# Patient Record
Sex: Female | Born: 1975 | Hispanic: No | Marital: Married | Smoking: Light tobacco smoker
Health system: Southern US, Community
[De-identification: ages and names within clinical notes are randomized; demographics above are authoritative.]

## PROBLEM LIST (undated history)

## (undated) DIAGNOSIS — R600 Localized edema: Secondary | ICD-10-CM

## (undated) DIAGNOSIS — M545 Low back pain, unspecified: Secondary | ICD-10-CM

## (undated) DIAGNOSIS — K5900 Constipation, unspecified: Secondary | ICD-10-CM

## (undated) DIAGNOSIS — E669 Obesity, unspecified: Secondary | ICD-10-CM

## (undated) DIAGNOSIS — K4090 Unilateral inguinal hernia, without obstruction or gangrene, not specified as recurrent: Secondary | ICD-10-CM

## (undated) DIAGNOSIS — R0602 Shortness of breath: Secondary | ICD-10-CM

## (undated) DIAGNOSIS — G8929 Other chronic pain: Secondary | ICD-10-CM

## (undated) DIAGNOSIS — E781 Pure hyperglyceridemia: Secondary | ICD-10-CM

## (undated) DIAGNOSIS — R7303 Prediabetes: Secondary | ICD-10-CM

## (undated) HISTORY — DX: Low back pain: M54.5

## (undated) HISTORY — DX: Obesity, unspecified: E66.9

## (undated) HISTORY — DX: Localized edema: R60.0

## (undated) HISTORY — DX: Low back pain, unspecified: M54.50

## (undated) HISTORY — DX: Other chronic pain: G89.29

## (undated) HISTORY — PX: TUBAL LIGATION: SHX77

## (undated) HISTORY — DX: Constipation, unspecified: K59.00

## (undated) HISTORY — DX: Unilateral inguinal hernia, without obstruction or gangrene, not specified as recurrent: K40.90

## (undated) HISTORY — DX: Shortness of breath: R06.02

---
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
## 1997-12-19 ENCOUNTER — Emergency Department (HOSPITAL_COMMUNITY): Admission: EM | Admit: 1997-12-19 | Discharge: 1997-12-19 | Payer: Self-pay | Admitting: Emergency Medicine

## 1998-01-19 ENCOUNTER — Emergency Department (HOSPITAL_COMMUNITY): Admission: EM | Admit: 1998-01-19 | Discharge: 1998-01-19 | Payer: Self-pay | Admitting: Emergency Medicine

## 1999-04-11 ENCOUNTER — Emergency Department (HOSPITAL_COMMUNITY): Admission: EM | Admit: 1999-04-11 | Discharge: 1999-04-11 | Payer: Self-pay | Admitting: Emergency Medicine

## 1999-04-11 ENCOUNTER — Encounter: Payer: Self-pay | Admitting: Emergency Medicine

## 1999-05-14 ENCOUNTER — Other Ambulatory Visit: Admission: RE | Admit: 1999-05-14 | Discharge: 1999-05-14 | Payer: Self-pay | Admitting: Obstetrics

## 1999-05-21 ENCOUNTER — Ambulatory Visit (HOSPITAL_COMMUNITY): Admission: RE | Admit: 1999-05-21 | Discharge: 1999-05-21 | Payer: Self-pay | Admitting: Obstetrics

## 1999-05-21 ENCOUNTER — Encounter: Payer: Self-pay | Admitting: Obstetrics

## 1999-06-27 ENCOUNTER — Inpatient Hospital Stay (HOSPITAL_COMMUNITY): Admission: AD | Admit: 1999-06-27 | Discharge: 1999-06-27 | Payer: Self-pay | Admitting: Obstetrics

## 1999-11-13 ENCOUNTER — Encounter (INDEPENDENT_AMBULATORY_CARE_PROVIDER_SITE_OTHER): Payer: Self-pay

## 1999-11-13 ENCOUNTER — Inpatient Hospital Stay (HOSPITAL_COMMUNITY): Admission: AD | Admit: 1999-11-13 | Discharge: 1999-11-16 | Payer: Self-pay | Admitting: Obstetrics

## 2000-03-01 ENCOUNTER — Emergency Department (HOSPITAL_COMMUNITY): Admission: EM | Admit: 2000-03-01 | Discharge: 2000-03-01 | Payer: Self-pay | Admitting: Emergency Medicine

## 2000-03-01 ENCOUNTER — Encounter: Payer: Self-pay | Admitting: Emergency Medicine

## 2003-08-30 ENCOUNTER — Emergency Department (HOSPITAL_COMMUNITY): Admission: EM | Admit: 2003-08-30 | Discharge: 2003-08-30 | Payer: Self-pay | Admitting: Emergency Medicine

## 2003-11-10 ENCOUNTER — Emergency Department (HOSPITAL_COMMUNITY): Admission: EM | Admit: 2003-11-10 | Discharge: 2003-11-10 | Payer: Self-pay | Admitting: Emergency Medicine

## 2006-11-21 ENCOUNTER — Emergency Department (HOSPITAL_COMMUNITY): Admission: EM | Admit: 2006-11-21 | Discharge: 2006-11-21 | Payer: Self-pay | Admitting: Emergency Medicine

## 2008-04-09 ENCOUNTER — Emergency Department (HOSPITAL_COMMUNITY): Admission: EM | Admit: 2008-04-09 | Discharge: 2008-04-10 | Payer: Self-pay | Admitting: Emergency Medicine

## 2010-05-08 ENCOUNTER — Emergency Department (HOSPITAL_COMMUNITY): Admission: EM | Admit: 2010-05-08 | Discharge: 2010-05-08 | Payer: Self-pay | Admitting: Emergency Medicine

## 2010-06-19 ENCOUNTER — Other Ambulatory Visit: Admission: RE | Admit: 2010-06-19 | Discharge: 2010-06-19 | Payer: Self-pay | Admitting: Family Medicine

## 2010-07-11 ENCOUNTER — Emergency Department (HOSPITAL_COMMUNITY)
Admission: EM | Admit: 2010-07-11 | Discharge: 2010-07-11 | Payer: Self-pay | Source: Home / Self Care | Admitting: Emergency Medicine

## 2010-10-23 LAB — RAPID STREP SCREEN (MED CTR MEBANE ONLY): Streptococcus, Group A Screen (Direct): NEGATIVE

## 2010-10-23 LAB — STREP A DNA PROBE: Group A Strep Probe: NEGATIVE

## 2010-12-28 NOTE — Discharge Summary (Signed)
[facility]  Patient:    KAREEMAH, GROUNDS                     MRN: [id number] Adm. Date:  [id number] Disc. Date: [id number] Attending:  Venita Sheffield                           Discharge Summary  HISTORY:                      This is a 35 year old, gravida 3, para 2-0-2, [facility]  November 20, 1999, who was admitted in labor, 5 cm, -2 station, positive GVS, was treated with penicillin.  Progressed slowly and had a normal vaginal delivery of female, Apgar 12 and 9 on November 14, 1999.  Post delivery the patient desired sterilization and on April 5 underwent postpartum tubal ligation.  Her hemoglobin on admission was 11.1, postop 10.3.  The patient did well and was discharged home on November 16, 1999, ambulatory and on a regular diet.  DISCHARGE MEDICATIONS:        Tylox No 3. one q.4-h. p.r.n.  FOLLOWUP:                     See me in six weeks.  DISCHARGE DIAGNOSES:          1. Status post normal vaginal delivery at term.  2. Postpartum tubal ligation. DD:  11/16/99 TD:  11/19/99 Job: [id number]

## 2010-12-28 NOTE — Op Note (Signed)
[facility]  Patient:    Julie Guerrero, Julie Guerrero                     MRN: [id number] Proc. Date: 11/15/99 Adm. Date:  [id number] Attending:  Venita Sheffield                           Operative Report  5PREOPERATIVE DIAGNOSIS:       Multiparity.  POSTOPERATIVE DIAGNOSIS:  OPERATION:                    Postpartum tubal ligation.  SURGEON:                      Kathreen Cosier, M.D.  ASSISTANT:  ANESTHESIA:                   Epidural  ESTIMATED BLOOD LOSS:  INDICATIONS:  DESCRIPTION OF PROCEDURE:     The patient was placed on the operating room table in the supine position.  The abdomen prepped and draped.  Bladder emptied with straight  catheter.   A midline subumbilical incision 1 inch long was made and carried on to the fascia.  The fascia was cleaned and grasped with two Kochers.  The fascia and the peritoneum were opened with Mayo scissors.  The left tube was grasped in the mid portion with a Babcock clamp and the tube traced to the fimbria. A 0 plain suture was placed in the mesosalpinx below the portion of tube within the clamp and this was tied and approximately 1 inch of tube transected.  This was one bilaterally.  The lap and sponge count was correct.  The abdomen was closed in layers. Peritoneum and fascia with continuous suture of 0 Dexon and skin closed  with subcuticular stitch of 3-0 plain.  The patient tolerated the procedure well and returned to the recovery room in good condition. DD:  11/15/99 TD:  11/15/99 Job: [id number]

## 2012-01-16 ENCOUNTER — Emergency Department (HOSPITAL_COMMUNITY): Payer: PRIVATE HEALTH INSURANCE

## 2012-01-16 ENCOUNTER — Emergency Department (HOSPITAL_COMMUNITY)
Admission: EM | Admit: 2012-01-16 | Discharge: 2012-01-16 | Disposition: A | Discharge status: Home / Self Care | Payer: PRIVATE HEALTH INSURANCE | Attending: Emergency Medicine | Admitting: Emergency Medicine

## 2012-01-16 ENCOUNTER — Encounter (HOSPITAL_COMMUNITY): Payer: Self-pay | Admitting: *Deleted

## 2012-01-16 DIAGNOSIS — T148XXA Other injury of unspecified body region, initial encounter: Secondary | ICD-10-CM

## 2012-01-16 DIAGNOSIS — X58XXXA Exposure to other specified factors, initial encounter: Secondary | ICD-10-CM | POA: Insufficient documentation

## 2012-01-16 DIAGNOSIS — M549 Dorsalgia, unspecified: Secondary | ICD-10-CM | POA: Insufficient documentation

## 2012-01-16 DIAGNOSIS — M25519 Pain in unspecified shoulder: Secondary | ICD-10-CM | POA: Insufficient documentation

## 2012-01-16 MED ORDER — KETOROLAC TROMETHAMINE 60 MG/2ML IM SOLN
INTRAMUSCULAR | Status: AC
  Administered 2012-01-16: 60 mg via INTRAMUSCULAR
  Filled 2012-01-16: qty 2

## 2012-01-16 MED ORDER — KETOROLAC TROMETHAMINE 30 MG/ML IJ SOLN: 60.0000 mg | Freq: Once | INTRAMUSCULAR | Status: DC

## 2012-01-16 MED ORDER — NAPROXEN 500 MG PO TABS: 500.0000 mg | ORAL_TABLET | Freq: Two times a day (BID) | ORAL | Status: AC

## 2012-01-16 MED ORDER — HYDROCODONE-ACETAMINOPHEN 5-325 MG PO TABS: 1.0000 | ORAL_TABLET | Freq: Four times a day (QID) | ORAL | Status: AC | PRN

## 2012-01-16 MED ORDER — HYDROMORPHONE HCL PF 1 MG/ML IJ SOLN
1.0000 mg | Freq: Once | INTRAMUSCULAR | Status: AC
  Administered 2012-01-16: 1 mg via INTRAMUSCULAR
  Filled 2012-01-16: qty 1

## 2012-01-16 NOTE — Discharge Instructions (Signed)
Rest at home until Monday.  Follow up next week if not improving.

## 2012-01-16 NOTE — ED Notes (Signed)
Patient transported to X-ray 

## 2012-01-16 NOTE — ED Provider Notes (Signed)
History   This chart was scribed for Julie Lennert, MD by Julie Guerrero . The patient was seen in room STRE1/STRE1.   CSN: [id number]  Arrival date & time 01/16/12  1443   First MD Initiated Contact with Patient 01/16/12 1458      Chief Complaint  Patient presents with  . Back Pain    (Consider location/radiation/quality/duration/timing/severity/associated sxs/prior treatment) HPI Comments: Patient states that she has constant, moderate left upper back/shoulder pain for the past 2 weeks. Patient states that her back pain has worsened over past 2 days. Patient denies any recent injuries. Patient states that the pain is worsened with movement. Nothing makes the symptoms better.   Patient is a 36 y.o. female presenting with back pain. The history is provided by the patient.  Back Pain  This is a new problem. The current episode started more than 1 week ago. The problem occurs constantly. The problem has been gradually worsening. The pain is associated with no known injury. The pain is present in the thoracic spine. The pain does not radiate. The pain is moderate. Pertinent negatives include no fever and no weakness. She has tried nothing for the symptoms.    History reviewed. No pertinent past medical history.  History reviewed. No pertinent past surgical history.  History reviewed. No pertinent family history.  History  Substance Use Topics  . Smoking status: Current Everyday Smoker    Types: Cigarettes  . Smokeless tobacco: Not on file  . Alcohol Use: No    OB History    Grav Para Term Preterm Abortions TAB SAB Ect Mult Living                  Review of Systems  Constitutional: Negative for fever and chills.  Respiratory: Negative for shortness of breath.   Gastrointestinal: Negative for nausea and vomiting.  Musculoskeletal: Positive for back pain.  Neurological: Negative for weakness.  All other systems reviewed and are negative.    Allergies  Review  of patient's allergies indicates no known allergies.  Home Medications   Current Outpatient Rx  Name Route Sig Dispense Refill  . BC FAST PAIN RELIEF ARTHRITIS PO Oral Take 1 packet by mouth daily as needed. For pain    . IBUPROFEN 200 MG PO TABS Oral Take 600 mg by mouth every 6 (six) hours as needed. For pain      BP 126/69  Pulse 63  Temp(Src) 98.2 F (36.8 C) (Oral)  Resp 18  SpO2 100%  LMP 01/07/2012  Physical Exam  Constitutional: She is oriented to person, place, and time. She appears well-developed.  HENT:  Head: Normocephalic and atraumatic.  Eyes: Conjunctivae and EOM are normal. No scleral icterus.  Neck: Neck supple. No thyromegaly present.  Cardiovascular: Normal rate and regular rhythm.  Exam reveals no gallop and no friction rub.   No murmur heard. Pulmonary/Chest: No stridor. She has no wheezes. She has no rales. She exhibits no tenderness.  Abdominal: She exhibits no distension. There is no tenderness. There is no rebound.  Musculoskeletal: Normal range of motion. She exhibits tenderness. She exhibits no edema.       Tenderness in left trapezius muscle.   Lymphadenopathy:    She has no cervical adenopathy.  Neurological: She is oriented to person, place, and time. Coordination normal.       Strength 5/5. Neurovascular exam is normal.   Skin: No rash noted. No erythema.  Psychiatric: She has a normal mood and affect.  Her behavior is normal.    ED Course  Procedures (including critical care time)  DIAGNOSTIC STUDIES: Oxygen Saturation is 100% on room air, normal by my interpretation.    COORDINATION OF CARE:  1511: Discussed planned course of treatment with the patient, who is agreeable at this time. Will order x-ray. 1515: Medication Orders: Ketorolac (Toradol) 30 mg/mL injection 60 mg-once.  1631: Recheck: Informed patient of imaging results and planned d/c.    Labs Reviewed - No data to display Dg Chest 2 View  01/16/2012  *RADIOLOGY REPORT*   Clinical Data: 36 year old female with left shoulder pain.  CHEST - 2 VIEW  Comparison: None.  Findings: Slightly shallow lung volumes.  Cardiac size and mediastinal contours are within normal limits.  Visualized tracheal air column is within normal limits.  The lungs are clear.  No pneumothorax or effusion. Bone mineralization is within normal limits. No acute osseous abnormality identified.  IMPRESSION: Negative, no acute cardiopulmonary abnormality.  Original Report Authenticated By: Julie Guerrero, M.D.   Dg Cervical Spine Complete  01/16/2012  *RADIOLOGY REPORT*  Clinical Data: Back pain  CERVICAL SPINE - COMPLETE 4+ VIEW  Comparison: None  Findings: Five views of cervical spine submitted.  No acute fracture or subluxation.  Minimal disc space flattening at C4-C5 level.  No prevertebral soft tissue swelling.  C1-C2 relationship is unremarkable.  IMPRESSION: No acute fracture or subluxation.  Minimal disc space flattening at C4-C5 level.  Original Report Authenticated By: Julie Guerrero, M.D.     No diagnosis found.    MDM     The chart was scribed for me under my direct supervision.  I personally performed the history, physical, and medical decision making and all procedures in the evaluation of this patient.Julie Lennert, MD 01/16/12 [phone]

## 2012-01-16 NOTE — ED Notes (Signed)
Reports left upper back/shoulder pain x 2 weeks, denies injury, ambulatory at triage.

## 2013-09-14 ENCOUNTER — Emergency Department (HOSPITAL_COMMUNITY)
Admission: EM | Admit: 2013-09-14 | Discharge: 2013-09-14 | Disposition: A | Discharge status: Home / Self Care | Payer: PRIVATE HEALTH INSURANCE | Attending: Emergency Medicine | Admitting: Emergency Medicine

## 2013-09-14 ENCOUNTER — Encounter (HOSPITAL_COMMUNITY): Payer: Self-pay | Admitting: Emergency Medicine

## 2013-09-14 DIAGNOSIS — X500XXA Overexertion from strenuous movement or load, initial encounter: Secondary | ICD-10-CM | POA: Insufficient documentation

## 2013-09-14 DIAGNOSIS — Y9389 Activity, other specified: Secondary | ICD-10-CM | POA: Insufficient documentation

## 2013-09-14 DIAGNOSIS — S86911A Strain of unspecified muscle(s) and tendon(s) at lower leg level, right leg, initial encounter: Secondary | ICD-10-CM

## 2013-09-14 DIAGNOSIS — Y9289 Other specified places as the place of occurrence of the external cause: Secondary | ICD-10-CM | POA: Insufficient documentation

## 2013-09-14 DIAGNOSIS — F17200 Nicotine dependence, unspecified, uncomplicated: Secondary | ICD-10-CM | POA: Insufficient documentation

## 2013-09-14 DIAGNOSIS — IMO0002 Reserved for concepts with insufficient information to code with codable children: Secondary | ICD-10-CM | POA: Insufficient documentation

## 2013-09-14 MED ORDER — CYCLOBENZAPRINE HCL 10 MG PO TABS: 10.0000 mg | ORAL_TABLET | Freq: Two times a day (BID) | ORAL | Status: DC | PRN

## 2013-09-14 MED ORDER — IBUPROFEN 800 MG PO TABS: 800.0000 mg | ORAL_TABLET | Freq: Three times a day (TID) | ORAL | Status: DC

## 2013-09-14 NOTE — ED Notes (Signed)
Pt reporting yesterday she was bending over to get groceries out of her truck and felt her right calf muscle "pull". No swelling or redness noted. Pt can bear weight. Can flex/dorsiflex her foot.

## 2013-09-14 NOTE — ED Provider Notes (Signed)
Medical screening examination/treatment/procedure(s) were performed by non-physician practitioner and as supervising physician I was immediately available for consultation/collaboration.  EKG Interpretation   None         Shanna CiscoMegan E Lambert Jeanty, MD 09/14/13 2054

## 2013-09-14 NOTE — Discharge Instructions (Signed)
RICE: Routine Care for Injuries The routine care of many injuries includes Rest, Ice, Compression, and Elevation (RICE). HOME CARE INSTRUCTIONS  Rest is needed to allow your body to heal. Routine activities can usually be resumed when comfortable. Injured tendons and bones can take up to 6 weeks to heal. Tendons are the cord-like structures that attach muscle to bone.  Ice following an injury helps keep the swelling down and reduces pain.  Put ice in a plastic bag.  Place a towel between your skin and the bag.  Leave the ice on for 15-20 minutes, 03-04 times a day. Do this while awake, for the first 24 to 48 hours. After that, continue as directed by your caregiver.  Compression helps keep swelling down. It also gives support and helps with discomfort. If an elastic bandage has been applied, it should be removed and reapplied every 3 to 4 hours. It should not be applied tightly, but firmly enough to keep swelling down. Watch fingers or toes for swelling, bluish discoloration, coldness, numbness, or excessive pain. If any of these problems occur, remove the bandage and reapply loosely. Contact your caregiver if these problems continue.  Elevation helps reduce swelling and decreases pain. With extremities, such as the arms, hands, legs, and feet, the injured area should be placed near or above the level of the heart, if possible. SEEK IMMEDIATE MEDICAL CARE IF:  You have persistent pain and swelling.  You develop redness, numbness, or unexpected weakness.  Your symptoms are getting worse rather than improving after several days. These symptoms may indicate that further evaluation or further X-rays are needed. Sometimes, X-rays may not show a small broken bone (fracture) until 1 week or 10 days later. Make a follow-up appointment with your caregiver. Ask when your X-ray results will be ready. Make sure you get your X-ray results. Document Released: 11/10/2000 Document Revised: 10/21/2011  Document Reviewed: 12/28/2010 [employer] Patient Information 2014 [employer].  

## 2013-09-14 NOTE — ED Provider Notes (Signed)
CSN: [id number]     Arrival date & time 09/14/13  1455 History  This chart was scribed for non-physician practitioner, Majel HomerBowie Giamarie Bueche-PA, working with Shanna CiscoMegan E Docherty, MD by Smiley HousemanFallon Bowsher, ED Scribe. This patient was seen in room TR10C/TR10C and the patient's care was started at 4:10 PM.  Chief Complaint  Patient presents with  . Leg Pain   The history is provided by the patient. No language interpreter was used.   HPI Comments: Julie Guerrero is a 38 y.o. female who presents to the Emergency Department complaining of constant sharp right leg pain around her calf that started yesterday morning.  Pt states that she was reaching in her car to grab groceries in her back seat and felt "tearing" in her leg.  Pt denies pain radiating into her thigh and ankle.  Pt states that she can move her leg, but can't ambulate well.  Pt states that she is having to limp when she walks.  Pt states that it is painful to touch or squeeze.  Pt denies numbness and tingling in her right leg.  Pt states that her leg is more swollen than normal.  Pt denies being immobilized for long periods of time recently.  Pt denies fever and chills.  She also denies any family h/o of blot clots.  Pt states that she has tried ice, heat, medication patches, and ibuprofen without relief.  Pt states that she smokes about 6 cigarettes per day, but denies alcohol and substance use.  Pt denies taking any daily medication, including birth control.  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     History reviewed. No pertinent past medical history. History reviewed. No pertinent past surgical history. No family history on file. History  Substance Use Topics  . Smoking status: Current Every Day Smoker    Types: Cigarettes  . Smokeless tobacco: Not on file  . Alcohol Use: No   OB History   Grav Para Term Preterm Abortions TAB SAB Ect Mult Living                 Review of Systems  Constitutional: Negative for fever and  chills.  HENT: Negative for congestion and sore throat.   Respiratory: Negative for shortness of breath.   Cardiovascular: Negative for chest pain.  Musculoskeletal: Negative for back pain.       Right leg pain  Skin: Negative for color change and rash.    Allergies  Review of patient's allergies indicates no known allergies.  Home Medications   Current Outpatient Rx  Name  Route  Sig  Dispense  Refill  . ibuprofen (ADVIL,MOTRIN) 200 MG tablet   Oral   Take 600 mg by mouth every 6 (six) hours as needed (pain). For pain          Triage Vitals: BP 139/73  Pulse 66  Temp(Src) 98 F (36.7 C) (Oral)  Resp 16  Ht 5\' 7"  (1.702 m)  Wt 257 lb 8 oz (116.801 kg)  BMI 40.32 kg/m2  SpO2 100%  LMP 09/02/2013 Physical Exam  Nursing note and vitals reviewed. Constitutional: She is oriented to person, place, and time. She appears well-developed and well-nourished. No distress.  HENT:  Head: Normocephalic and atraumatic.  Eyes: Conjunctivae and EOM are normal. Right eye exhibits no discharge. Left eye exhibits no discharge.  Neck: Neck supple. No tracheal deviation present.  Cardiovascular: Normal rate.   Pulmonary/Chest: Effort normal. No respiratory distress.  Musculoskeletal: Normal  range of motion. She exhibits tenderness (tenderness to calf of R lower leg without overlying skin changes.  No palpable cords, no edema, no erythema.  ).       Right lower leg: She exhibits tenderness and swelling. She exhibits no deformity and no laceration.  Normal ROM of lower extremities bilaterally.    Neurological: She is alert and oriented to person, place, and time. No cranial nerve deficit.  Skin: Skin is warm and dry. No rash noted.  Psychiatric: She has a normal mood and affect. Her behavior is normal.    ED Course  Procedures (including critical care time) DIAGNOSTIC STUDIES: Oxygen Saturation is 100% on RA, normal by my interpretation.    COORDINATION OF CARE: 4:25 PM-Pt has pain  suggestive of muscle strain.  Doubt achilles tendon rupture, doubt DVT, cellulitis, or abscess.  No bony tenderness to suggest fx. Will prescribe muscle relaxer and Motrin.  Will refer pt to orthopedic specialist.  Patient informed of current plan of treatment and evaluation and agrees with plan.  Pt able to ambulate.      Labs Review Labs Reviewed - No data to display Imaging Review No results found.  EKG Interpretation   None       MDM   1. Muscle strain of right lower leg    BP 139/73  Pulse 66  Temp(Src) 98 F (36.7 C) (Oral)  Resp 16  Ht 5\' 7"  (1.702 m)  Wt 257 lb 8 oz (116.801 kg)  BMI 40.32 kg/m2  SpO2 100%  LMP 09/02/2013   I personally performed the services described in this documentation, which was scribed in my presence. The recorded information has been reviewed and is accurate.     Fayrene Helper, PA-C 09/14/13 [phone]

## 2015-03-17 ENCOUNTER — Encounter (HOSPITAL_COMMUNITY): Payer: Self-pay | Admitting: Emergency Medicine

## 2015-03-17 ENCOUNTER — Emergency Department (HOSPITAL_COMMUNITY)
Admission: EM | Admit: 2015-03-17 | Discharge: 2015-03-17 | Disposition: A | Discharge status: Home / Self Care | Payer: PRIVATE HEALTH INSURANCE | Attending: Emergency Medicine | Admitting: Emergency Medicine

## 2015-03-17 DIAGNOSIS — M545 Low back pain: Secondary | ICD-10-CM | POA: Diagnosis present

## 2015-03-17 DIAGNOSIS — M5441 Lumbago with sciatica, right side: Secondary | ICD-10-CM | POA: Diagnosis not present

## 2015-03-17 DIAGNOSIS — M79604 Pain in right leg: Secondary | ICD-10-CM | POA: Diagnosis not present

## 2015-03-17 DIAGNOSIS — Z791 Long term (current) use of non-steroidal anti-inflammatories (NSAID): Secondary | ICD-10-CM | POA: Diagnosis not present

## 2015-03-17 DIAGNOSIS — Z720 Tobacco use: Secondary | ICD-10-CM | POA: Diagnosis not present

## 2015-03-17 MED ORDER — METHOCARBAMOL 500 MG PO TABS: 500.0000 mg | ORAL_TABLET | Freq: Two times a day (BID) | ORAL | Status: DC

## 2015-03-17 MED ORDER — NAPROXEN 500 MG PO TABS: 500.0000 mg | ORAL_TABLET | Freq: Two times a day (BID) | ORAL | Status: DC

## 2015-03-17 MED ORDER — OXYCODONE-ACETAMINOPHEN 5-325 MG PO TABS: 2.0000 | ORAL_TABLET | ORAL | Status: DC | PRN

## 2015-03-17 MED ORDER — CYCLOBENZAPRINE HCL 10 MG PO TABS
5.0000 mg | ORAL_TABLET | Freq: Once | ORAL | Status: DC
  Filled 2015-03-17: qty 1

## 2015-03-17 MED ORDER — OXYCODONE-ACETAMINOPHEN 5-325 MG PO TABS
2.0000 | ORAL_TABLET | Freq: Once | ORAL | Status: DC
Start: 2015-03-17 — End: 2015-03-17
  Filled 2015-03-17: qty 2

## 2015-03-17 NOTE — ED Notes (Signed)
Pt sts lower back pain x 3 days with radiation to right leg; pt sts pain worse with movement

## 2015-03-17 NOTE — ED Provider Notes (Signed)
CSN: [id number]     Arrival date & time 03/17/15  1819 History  This chart was scribed for non-physician practitioner, Catha Gosselin, PA-C working with Laurence Spates, MD by Gwenyth Ober, ED scribe. This patient was seen in room TR09C/TR09C and the patient's care was started at 7:08 PM    Chief Complaint  Patient presents with  . Back Pain   The history is provided by the patient. No language interpreter was used.   HPI Comments: Julie Guerrero is a 39 y.o. female who presents to the Emergency Department complaining of intermittent, gradually worsening lower back pain, worse on the right, that radiates to her right upper leg and started 4 days ago. Her pain improves with walking, but becomes worse with bending. Pt has tried applying heating pads, Icy Hot, BC powder and Ibuprofen with some relief. Pt works as a Engineer, production at [employer]. She denies recent Prednisone treatments, history of cancer and IV drug use. Pt also denies bladder and bowel incontinence and numbness of her toes as associated symptoms.   History reviewed. No pertinent past medical history. History reviewed. No pertinent past surgical history. History reviewed. No pertinent family history. History  Substance Use Topics  . Smoking status: Current Every Day Smoker    Types: Cigarettes  . Smokeless tobacco: Not on file  . Alcohol Use: Yes     Comment: occ   OB History    No data available     Review of Systems  Musculoskeletal: Positive for back pain and arthralgias.  Neurological: Negative for numbness.   Allergies  Review of patient's allergies indicates no known allergies.  Home Medications   Prior to Admission medications   Medication Sig Start Date End Date Taking? Authorizing Provider  cyclobenzaprine (FLEXERIL) 10 MG tablet Take 1 tablet (10 mg total) by mouth 2 (two) times daily as needed for muscle spasms. 09/14/13   Fayrene Helper, PA-C  ibuprofen (ADVIL,MOTRIN) 800 MG tablet Take 1 tablet (800 mg total)  by mouth 3 (three) times daily. 09/14/13   Fayrene Helper, PA-C  methocarbamol (ROBAXIN) 500 MG tablet Take 1 tablet (500 mg total) by mouth 2 (two) times daily. 03/17/15   Jeven Topper Patel-Mills, PA-C  naproxen (NAPROSYN) 500 MG tablet Take 1 tablet (500 mg total) by mouth 2 (two) times daily. 03/17/15   Saagar Tortorella Patel-Mills, PA-C  oxyCODONE-acetaminophen (PERCOCET/ROXICET) 5-325 MG per tablet Take 2 tablets by mouth every 4 (four) hours as needed for severe pain. 03/17/15   Grasiela Jonsson Patel-Mills, PA-C   BP 142/73 mmHg  Pulse 65  Temp(Src) 98 F (36.7 C) (Oral)  Resp 16  SpO2 100% Physical Exam  Constitutional: She is oriented to person, place, and time. She appears well-developed and well-nourished. No distress.  HENT:  Head: Normocephalic and atraumatic.  Eyes: Conjunctivae and EOM are normal.  Neck: Neck supple. No tracheal deviation present.  Cardiovascular: Normal rate.   Pulses:      Dorsalis pedis pulses are 2+ on the right side, and 2+ on the left side.  Pulmonary/Chest: Effort normal. No respiratory distress.  Neurological: She is alert and oriented to person, place, and time.  Negative straight leg raise No saddle anesthesia No midline lumbar vertebral tenderness Right paravertebral TTP  Skin: Skin is warm and dry.  Psychiatric: She has a normal mood and affect. Her behavior is normal.  Nursing note and vitals reviewed.   ED Course  Procedures   DIAGNOSTIC STUDIES: Oxygen Saturation is 100% on RA, normal by my interpretation.  COORDINATION OF CARE: 7:19 PM Discussed treatment plan with pt which includes a muscle relaxant, Percocet and Naprosyn. Pt agreed to plan.  Labs Review Labs Reviewed - No data to display  Imaging Review No results found.   EKG Interpretation None      MDM  Pt arrived to the ED with complaint of lower back pain. No signs of caudal equina syndrome. No lower extremity weakness. Ambulatory. Vitals stable.  Final diagnoses:  Right-sided low back pain with  right-sided sciatica  She was given flexeril, naproxen, and percocet. I gave her return precautions and she verbally agrees with the plan.  I personally performed the services described in this documentation, which was scribed in my presence. The recorded information has been reviewed and is accurate.   Catha Gosselin, PA-C 03/18/15 1230  Laurence Spates, MD 03/20/15 [phone]

## 2015-03-17 NOTE — Discharge Instructions (Signed)
Back Exercises Follow-up with [facility]. Return for any bowel or bladder incontinence or retention, weakness in your lower extremities. Back exercises help treat and prevent back injuries. The goal of back exercises is to increase the strength of your abdominal and back muscles and the flexibility of your back. These exercises should be started when you no longer have back pain. Back exercises include:  Pelvic Tilt. Lie on your back with your knees bent. Tilt your pelvis until the lower part of your back is against the floor. Hold this position 5 to 10 sec and repeat 5 to 10 times.  Knee to Chest. Pull first 1 knee up against your chest and hold for 20 to 30 seconds, repeat this with the other knee, and then both knees. This may be done with the other leg straight or bent, whichever feels better.  Sit-Ups or Curl-Ups. Bend your knees 90 degrees. Start with tilting your pelvis, and do a partial, slow sit-up, lifting your trunk only 30 to 45 degrees off the floor. Take at least 2 to 3 seconds for each sit-up. Do not do sit-ups with your knees out straight. If partial sit-ups are difficult, simply do the above but with only tightening your abdominal muscles and holding it as directed.  Hip-Lift. Lie on your back with your knees flexed 90 degrees. Push down with your feet and shoulders as you raise your hips a couple inches off the floor; hold for 10 seconds, repeat 5 to 10 times.  Back arches. Lie on your stomach, propping yourself up on bent elbows. Slowly press on your hands, causing an arch in your low back. Repeat 3 to 5 times. Any initial stiffness and discomfort should lessen with repetition over time.  Shoulder-Lifts. Lie face down with arms beside your body. Keep hips and torso pressed to floor as you slowly lift your head and shoulders off the floor. Do not overdo your exercises, especially in the beginning.  Exercises may cause you some mild back discomfort which lasts for a few minutes; however, if the pain is more severe, or lasts for more than 15 minutes, do not continue exercises until you see your caregiver. Improvement with exercise therapy for back problems is slow.  See your caregivers for assistance with developing a proper back exercise program. Document Released: 09/05/2004 Document Revised: 10/21/2011 Document Reviewed: 05/30/2011 [facility] Patient Information 2015 [place], [place]. This information is not intended to replace advice given to you by your health care provider. Make sure you discuss any questions you have with your health care provider.

## 2015-12-28 ENCOUNTER — Encounter (HOSPITAL_COMMUNITY): Payer: Self-pay | Admitting: *Deleted

## 2015-12-28 ENCOUNTER — Emergency Department (HOSPITAL_COMMUNITY)
Admission: EM | Admit: 2015-12-28 | Discharge: 2015-12-28 | Disposition: A | Discharge status: Home / Self Care | Payer: 59 | Attending: Emergency Medicine | Admitting: Emergency Medicine

## 2015-12-28 DIAGNOSIS — Z79899 Other long term (current) drug therapy: Secondary | ICD-10-CM | POA: Insufficient documentation

## 2015-12-28 DIAGNOSIS — M545 Low back pain, unspecified: Secondary | ICD-10-CM

## 2015-12-28 DIAGNOSIS — F17210 Nicotine dependence, cigarettes, uncomplicated: Secondary | ICD-10-CM | POA: Insufficient documentation

## 2015-12-28 MED ORDER — MELOXICAM 15 MG PO TABS: 15.0000 mg | ORAL_TABLET | Freq: Every day | ORAL | Status: DC

## 2015-12-28 MED ORDER — PREDNISONE 20 MG PO TABS: 40.0000 mg | ORAL_TABLET | Freq: Every day | ORAL | Status: DC

## 2015-12-28 MED ORDER — METHOCARBAMOL 500 MG PO TABS: 500.0000 mg | ORAL_TABLET | Freq: Two times a day (BID) | ORAL | Status: DC

## 2015-12-28 NOTE — ED Notes (Signed)
Pt reports R lower back pain x 2 days.  Denies any injury at this time.  Pt is ambulatory with steady gait.

## 2015-12-28 NOTE — Discharge Instructions (Signed)
mobic for pain and inflammation. Stop ibuprofen. Take prednisone as prescribed until all gone. Take robaxin for spasms. See exercises below. Follow up with family doctor. Return if fever, worsening pain, weakness or numbness in extremities.    Back Pain, Adult Back pain is very common in adults.The cause of back pain is rarely dangerous and the pain often gets better over time.The cause of your back pain may not be known. Some common causes of back pain include: 1. Strain of the muscles or ligaments supporting the spine. 2. Wear and tear (degeneration) of the spinal disks. 3. Arthritis. 4. Direct injury to the back. For many people, back pain may return. Since back pain is rarely dangerous, most people can learn to manage this condition on their own. HOME CARE INSTRUCTIONS Watch your back pain for any changes. The following actions may help to lessen any discomfort you are feeling: 1. Remain active. It is stressful on your back to sit or stand in one place for long periods of time. Do not sit, drive, or stand in one place for more than 30 minutes at a time. Take short walks on even surfaces as soon as you are able.Try to increase the length of time you walk each day. 2. Exercise regularly as directed by your health care provider. Exercise helps your back heal faster. It also helps avoid future injury by keeping your muscles strong and flexible. 3. Do not stay in bed.Resting more than 1-2 days can delay your recovery. 4. Pay attention to your body when you bend and lift. The most comfortable positions are those that put less stress on your recovering back. Always use proper lifting techniques, including: 1. Bending your knees. 2. Keeping the load close to your body. 3. Avoiding twisting. 5. Find a comfortable position to sleep. Use a firm mattress and lie on your side with your knees slightly bent. If you lie on your back, put a pillow under your knees. 6. Avoid feeling anxious or  stressed.Stress increases muscle tension and can worsen back pain.It is important to recognize when you are anxious or stressed and learn ways to manage it, such as with exercise. 7. Take medicines only as directed by your health care provider. Over-the-counter medicines to reduce pain and inflammation are often the most helpful.Your health care provider may prescribe muscle relaxant drugs.These medicines help dull your pain so you can more quickly return to your normal activities and healthy exercise. 8. Apply ice to the injured area: 1. Put ice in a plastic bag. 2. Place a towel between your skin and the bag. 3. Leave the ice on for 20 minutes, 2-3 times a day for the first 2-3 days. After that, ice and heat may be alternated to reduce pain and spasms. 9. Maintain a healthy weight. Excess weight puts extra stress on your back and makes it difficult to maintain good posture. SEEK MEDICAL CARE IF: 1. You have pain that is not relieved with rest or medicine. 2. You have increasing pain going down into the legs or buttocks. 3. You have pain that does not improve in one week. 4. You have night pain. 5. You lose weight. 6. You have a fever or chills. SEEK IMMEDIATE MEDICAL CARE IF:  1. You develop new bowel or bladder control problems. 2. You have unusual weakness or numbness in your arms or legs. 3. You develop nausea or vomiting. 4. You develop abdominal pain. 5. You feel faint.   This information is not intended to replace  advice given to you by your health care provider. Make sure you discuss any questions you have with your health care provider.   Document Released: 07/29/2005 Document Revised: 08/19/2014 Document Reviewed: 11/30/2013 Elsevier Interactive Patient Education 2016 [employer] Inc.  Back Exercises If you have pain in your back, do these exercises 2-3 times each day or as told by your doctor. When the pain goes away, do the exercises once each day, but repeat the steps more  times for each exercise (do more repetitions). If you do not have pain in your back, do these exercises once each day or as told by your doctor. EXERCISES Single Knee to Chest Do these steps 3-5 times in a row for each leg: 5. Lie on your back on a firm bed or the floor with your legs stretched out. 6. Bring one knee to your chest. 7. Hold your knee to your chest by grabbing your knee or thigh. 8. Pull on your knee until you feel a gentle stretch in your lower back. 9. Keep doing the stretch for 10-30 seconds. 10. Slowly let go of your leg and straighten it. Pelvic Tilt Do these steps 5-10 times in a row: 10. Lie on your back on a firm bed or the floor with your legs stretched out. 11. Bend your knees so they point up to the ceiling. Your feet should be flat on the floor. 12. Tighten your lower belly (abdomen) muscles to press your lower back against the floor. This will make your tailbone point up to the ceiling instead of pointing down to your feet or the floor. 13. Stay in this position for 5-10 seconds while you gently tighten your muscles and breathe evenly. Cat-Cow Do these steps until your lower back bends more easily: 7. Get on your hands and knees on a firm surface. Keep your hands under your shoulders, and keep your knees under your hips. You may put padding under your knees. 8. Let your head hang down, and make your tailbone point down to the floor so your lower back is round like the back of a cat. 9. Stay in this position for 5 seconds. 10. Slowly lift your head and make your tailbone point up to the ceiling so your back hangs low (sags) like the back of a cow. 11. Stay in this position for 5 seconds. Press-Ups Do these steps 5-10 times in a row: 6. Lie on your belly (face-down) on the floor. 7. Place your hands near your head, about shoulder-width apart. 8. While you keep your back relaxed and keep your hips on the floor, slowly straighten your arms to raise the top half of  your body and lift your shoulders. Do not use your back muscles. To make yourself more comfortable, you may change where you place your hands. 9. Stay in this position for 5 seconds. 10. Slowly return to lying flat on the floor. Bridges Do these steps 10 times in a row: 1. Lie on your back on a firm surface. 2. Bend your knees so they point up to the ceiling. Your feet should be flat on the floor. 3. Tighten your butt muscles and lift your butt off of the floor until your waist is almost as high as your knees. If you do not feel the muscles working in your butt and the back of your thighs, slide your feet 1-2 inches farther away from your butt. 4. Stay in this position for 3-5 seconds. 5. Slowly lower your butt to the  floor, and let your butt muscles relax. If this exercise is too easy, try doing it with your arms crossed over your chest. Belly Crunches Do these steps 5-10 times in a row: 1. Lie on your back on a firm bed or the floor with your legs stretched out. 2. Bend your knees so they point up to the ceiling. Your feet should be flat on the floor. 3. Cross your arms over your chest. 4. Tip your chin a little bit toward your chest but do not bend your neck. 5. Tighten your belly muscles and slowly raise your chest just enough to lift your shoulder blades a tiny bit off of the floor. 6. Slowly lower your chest and your head to the floor. Back Lifts Do these steps 5-10 times in a row: 1. Lie on your belly (face-down) with your arms at your sides, and rest your forehead on the floor. 2. Tighten the muscles in your legs and your butt. 3. Slowly lift your chest off of the floor while you keep your hips on the floor. Keep the back of your head in line with the curve in your back. Look at the floor while you do this. 4. Stay in this position for 3-5 seconds. 5. Slowly lower your chest and your face to the floor. GET HELP IF:  Your back pain gets a lot worse when you do an exercise.  Your  back pain does not lessen 2 hours after you exercise. If you have any of these problems, stop doing the exercises. Do not do them again unless your doctor says it is okay. GET HELP RIGHT AWAY IF:  You have sudden, very bad back pain. If this happens, stop doing the exercises. Do not do them again unless your doctor says it is okay.   This information is not intended to replace advice given to you by your health care provider. Make sure you discuss any questions you have with your health care provider.   Document Released: 08/31/2010 Document Revised: 04/19/2015 Document Reviewed: 09/22/2014 Elsevier Interactive Patient Education [employer] Elsevier Inc.

## 2015-12-28 NOTE — ED Provider Notes (Signed)
CSN: [id number]     Arrival date & time 12/28/15  0701 History   First MD Initiated Contact with Patient 12/28/15 [phone]     Chief Complaint  Patient presents with  . Back Pain     (Consider location/radiation/quality/duration/timing/severity/associated sxs/prior Treatment) HPI Julie Guerrero is a 40 y.o. female presents to emergency department complaining of lower back pain. Patient states that she has had lower back pain on and off for several years. States that normally she develops pain in the right lower side but it usually improves within 5 days. She states this time she has had pain for a week and it has worsened in the last 2 days. Patient denies any injuries. She works as a Engineer, productionbaker. She denies any heavy lifting, pushing, pulling. She states it is worse when she is changing positions or when standing or walking. She denies pain radiating to the right leg. She denies any numbness or weakness in the leg. She denies any trouble controlling her bladder or bowels. She denies any fever. There are no urinary symptoms. She denies any abdominal pain. She has been taking ibuprofen which has not been helping. She does not have a primary care doctor.  History reviewed. No pertinent past medical history. History reviewed. No pertinent past surgical history. No family history on file. Social History  Substance Use Topics  . Smoking status: Current Every Day Smoker    Types: Cigarettes  . Smokeless tobacco: None  . Alcohol Use: Yes     Comment: occ   OB History    No data available     Review of Systems  Constitutional: Negative for fever and chills.  Respiratory: Negative for cough, chest tightness and shortness of breath.   Cardiovascular: Negative for chest pain, palpitations and leg swelling.  Gastrointestinal: Negative for nausea, vomiting, abdominal pain and diarrhea.  Genitourinary: Negative for dysuria, frequency, hematuria, flank pain, difficulty urinating and pelvic pain.   Musculoskeletal: Positive for back pain and arthralgias. Negative for myalgias, neck pain and neck stiffness.  Skin: Negative for rash.  Neurological: Negative for dizziness, weakness, numbness and headaches.  All other systems reviewed and are negative.     Allergies  Review of patient's allergies indicates no known allergies.  Home Medications   Prior to Admission medications   Medication Sig Start Date End Date Taking? Authorizing Provider  cyclobenzaprine (FLEXERIL) 10 MG tablet Take 1 tablet (10 mg total) by mouth 2 (two) times daily as needed for muscle spasms. 09/14/13   Fayrene HelperBowie Tran, PA-C  ibuprofen (ADVIL,MOTRIN) 800 MG tablet Take 1 tablet (800 mg total) by mouth 3 (three) times daily. 09/14/13   Fayrene HelperBowie Tran, PA-C  methocarbamol (ROBAXIN) 500 MG tablet Take 1 tablet (500 mg total) by mouth 2 (two) times daily. 03/17/15   Hanna Patel-Mills, PA-C  naproxen (NAPROSYN) 500 MG tablet Take 1 tablet (500 mg total) by mouth 2 (two) times daily. 03/17/15   Hanna Patel-Mills, PA-C  oxyCODONE-acetaminophen (PERCOCET/ROXICET) 5-325 MG per tablet Take 2 tablets by mouth every 4 (four) hours as needed for severe pain. 03/17/15   Hanna Patel-Mills, PA-C   BP 144/85 mmHg  Pulse 62  Temp(Src) 98.5 F (36.9 C) (Oral)  Resp 17  Ht 5' 7.5" (1.715 m)  Wt 117.935 kg  BMI 40.10 kg/m2  SpO2 99%  LMP 12/06/2015 Physical Exam  Constitutional: She is oriented to person, place, and time. She appears well-developed and well-nourished. No distress.  HENT:  Head: Normocephalic.  Eyes: Conjunctivae are normal.  Neck: Neck supple.  Cardiovascular: Normal rate, regular rhythm, normal heart sounds and intact distal pulses.   Pulmonary/Chest: Effort normal and breath sounds normal. No respiratory distress. She has no wheezes. She has no rales.  Abdominal: Soft. Bowel sounds are normal. She exhibits no distension. There is no tenderness. There is no rebound.  Musculoskeletal: She exhibits no edema.  No midline  lumbar spine tenderness. Tender to palpation in the right SI joint. Mild pain with right straight leg raise  Neurological: She is alert and oriented to person, place, and time.  5/5 and equal lower extremity strength. 2+ and equal patellar reflexes bilaterally. Pt able to dorsiflex bilateral toes and feet with good strength against resistance. Equal sensation bilaterally over thighs and lower legs.   Skin: Skin is warm and dry.  Psychiatric: She has a normal mood and affect. Her behavior is normal.  Nursing note and vitals reviewed.   ED Course  Procedures (including critical care time) Labs Review Labs Reviewed - No data to display  Imaging Review No results found. I have personally reviewed and evaluated these images and lab results as part of my medical decision-making.   EKG Interpretation None      MDM   Final diagnoses:  Right-sided low back pain without sciatica   Patient with lower back pain, mainly on the right side, with no radiation. She has no neuro deficits on exam. No signs or symptoms to suggest cauda equina. Patient is ambulatory. At this time will treat with Robaxin, steroid pack, low back. Will have patient follow with primary care doctor. Discussed signs and symptoms that should prompt her return.   Filed Vitals:   12/28/15 0718  BP: 144/85  Pulse: 62  Temp: 98.5 F (36.9 C)  TempSrc: Oral  Resp: 17  Height: 5' 7.5" (1.715 m)  Weight: 117.935 kg  SpO2: 99%     Jaynie Crumble, PA-C 12/28/15 1633  Gerhard Munch, MD 12/29/15 [phone]

## 2016-02-01 ENCOUNTER — Encounter (HOSPITAL_COMMUNITY): Payer: Self-pay | Admitting: Emergency Medicine

## 2016-02-01 DIAGNOSIS — F17210 Nicotine dependence, cigarettes, uncomplicated: Secondary | ICD-10-CM | POA: Diagnosis not present

## 2016-02-01 DIAGNOSIS — M5416 Radiculopathy, lumbar region: Secondary | ICD-10-CM | POA: Diagnosis not present

## 2016-02-01 DIAGNOSIS — M545 Low back pain: Secondary | ICD-10-CM | POA: Diagnosis present

## 2016-02-01 NOTE — ED Notes (Signed)
Pt here with lower back pain worse on the right side with radiation to right leg x 1 week. Pt sts no relief with OTC meds. Pt denies injury, Pt also denies any urinary symptoms. Pt ambulatory. No numbness.

## 2016-02-02 ENCOUNTER — Emergency Department (HOSPITAL_COMMUNITY)
Admission: EM | Admit: 2016-02-02 | Discharge: 2016-02-02 | Disposition: A | Discharge status: Home / Self Care | Payer: 59 | Attending: Emergency Medicine | Admitting: Emergency Medicine

## 2016-02-02 DIAGNOSIS — M5416 Radiculopathy, lumbar region: Secondary | ICD-10-CM

## 2016-02-02 MED ORDER — MELOXICAM 15 MG PO TABS: 15.0000 mg | ORAL_TABLET | Freq: Every day | ORAL | Status: DC

## 2016-02-02 MED ORDER — MELOXICAM 15 MG PO TABS
15.0000 mg | ORAL_TABLET | Freq: Once | ORAL | Status: AC
  Administered 2016-02-02: 15 mg via ORAL
  Filled 2016-02-02: qty 1

## 2016-02-02 MED ORDER — METHOCARBAMOL 500 MG PO TABS
500.0000 mg | ORAL_TABLET | Freq: Once | ORAL | Status: AC
  Administered 2016-02-02: 500 mg via ORAL
  Filled 2016-02-02: qty 1

## 2016-02-02 MED ORDER — METHOCARBAMOL 500 MG PO TABS: 500.0000 mg | ORAL_TABLET | Freq: Four times a day (QID) | ORAL | Status: DC | PRN

## 2016-02-02 NOTE — ED Notes (Signed)
Pt stable, ambulatory, states understanding of discharge instructions 

## 2016-02-02 NOTE — Discharge Instructions (Signed)
Take the medication as prescribed. Call [facility] to get a primary care physician and to be seen for all health problems. [facility] can also see you for ongoing back pain if needed

## 2016-02-02 NOTE — ED Provider Notes (Signed)
CSN: [id number]     Arrival date & time 02/01/16  2138 History   First MD Initiated Contact with Patient 02/02/16 0006     Chief Complaint  Patient presents with  . Back Pain     (Consider location/radiation/quality/duration/timing/severity/associated sxs/prior Treatment) HPI Complains of right-sided paralumbar back pain rating to right groin onset 1 month ago. Pain is worse with walking. She reports that occasionally her right leg "gives out." No loss of bladder or bowel control. No recent falls. Seen here last month for same complaint prescribed Moban Robaxin and prednisone with partial relief. She's been taking ibuprofen and BC powder for the past several days without adequate pain relief. She denies fever. Denies other associated symptoms. History reviewed. No pertinent past medical history. Past medical history negative History reviewed. No pertinent past surgical history. History reviewed. No pertinent family history. Social History  Substance Use Topics  . Smoking status: Current Every Day Smoker -- 0.25 packs/day    Types: Cigarettes  . Smokeless tobacco: None  . Alcohol Use: Yes     Comment: occ   OB History    No data available     Review of Systems  Constitutional: Negative.   Respiratory: Negative.   Cardiovascular: Negative.   Gastrointestinal: Negative.   Genitourinary:       Last normal menstrual period 01/23/2016  Musculoskeletal: Positive for back pain.      Allergies  Review of patient's allergies indicates no known allergies.  Home Medications   Prior to Admission medications   Medication Sig Start Date End Date Taking? Authorizing Provider  ibuprofen (ADVIL,MOTRIN) 200 MG tablet Take 800 mg by mouth every 8 (eight) hours as needed for fever, headache, mild pain, moderate pain or cramping.    Historical Provider, MD  meloxicam (MOBIC) 15 MG tablet Take 1 tablet (15 mg total) by mouth daily. 12/28/15   Tatyana Kirichenko, PA-C  methocarbamol (ROBAXIN)  500 MG tablet Take 1 tablet (500 mg total) by mouth 2 (two) times daily. 12/28/15   Tatyana Kirichenko, PA-C  predniSONE (DELTASONE) 20 MG tablet Take 2 tablets (40 mg total) by mouth daily. 12/28/15   Tatyana Kirichenko, PA-C   BP 146/81 mmHg  Pulse 75  Temp(Src) 98.6 F (37 C) (Oral)  Resp 20  Ht 5\' 7"  (1.702 m)  Wt 255 lb (115.667 kg)  BMI 39.93 kg/m2  SpO2 99%  LMP 01/23/2016 Physical Exam  Constitutional: She is oriented to person, place, and time. She appears well-developed and well-nourished.  HENT:  Head: Normocephalic and atraumatic.  Eyes: Conjunctivae are normal. Pupils are equal, round, and reactive to light.  Neck: Neck supple. No tracheal deviation present. No thyromegaly present.  Cardiovascular: Normal rate and regular rhythm.   No murmur heard. Pulmonary/Chest: Effort normal and breath sounds normal.  Abdominal: Soft. Bowel sounds are normal. She exhibits no distension. There is no tenderness.  Obese  Musculoskeletal: Normal range of motion. She exhibits tenderness. She exhibits no edema.  Right-sided paralumbar tenderness spine otherwise nontender. All 4 extremities without redness swelling or tenderness neurovascularly intact  Neurological: She is alert and oriented to person, place, and time. She has normal reflexes. No cranial nerve deficit. Coordination normal.  Gait normal motor strength 5 over 5 overall DTRs symmetric bilaterally at knee jerk ankle jerk biceps toes downward going bilaterally  Skin: Skin is warm and dry. No rash noted.  Psychiatric: She has a normal mood and affect.  Nursing note and vitals reviewed.   ED Course  Procedures (  including critical care time) Labs Review Labs Reviewed - No data to display  Imaging Review No results found. I have personally reviewed and evaluated these images and lab results as part of my medical decision-making.   EKG Interpretation None      MDM  Plan prescriptions Moicn, Robaxin. Referral [facility]. Emergent imaging not indicated. Discussed with patient who agrees. Diagnosis lumbar radiculopathy Final diagnoses:  None        Doug SouSam Anatalia Kronk, MD 02/02/16 0040

## 2016-05-16 ENCOUNTER — Ambulatory Visit (INDEPENDENT_AMBULATORY_CARE_PROVIDER_SITE_OTHER): Payer: 59 | Admitting: Family Medicine

## 2016-05-16 ENCOUNTER — Encounter: Payer: Self-pay | Admitting: Family Medicine

## 2016-05-16 ENCOUNTER — Ambulatory Visit: Payer: Self-pay | Admitting: Family Medicine

## 2016-05-16 VITALS — BP 140/90 | HR 69 | Temp 98.1°F | Wt 257.6 lb

## 2016-05-16 DIAGNOSIS — R319 Hematuria, unspecified: Secondary | ICD-10-CM | POA: Diagnosis not present

## 2016-05-16 DIAGNOSIS — Z7689 Persons encountering health services in other specified circumstances: Secondary | ICD-10-CM

## 2016-05-16 DIAGNOSIS — M545 Low back pain, unspecified: Secondary | ICD-10-CM

## 2016-05-16 LAB — CBC WITH DIFFERENTIAL/PLATELET
BASOS ABS: 0 {cells}/uL (ref 0–200)
Basophils Relative: 0 %
EOS ABS: 378 {cells}/uL (ref 15–500)
Eosinophils Relative: 3 %
HCT: 35 % (ref 35.0–45.0)
Hemoglobin: 11.3 g/dL — ABNORMAL LOW (ref 11.7–15.5)
LYMPHS PCT: 22 %
Lymphs Abs: 2772 cells/uL (ref 850–3900)
MCH: 25.6 pg — AB (ref 27.0–33.0)
MCHC: 32.3 g/dL (ref 32.0–36.0)
MCV: 79.2 fL — AB (ref 80.0–100.0)
MONOS PCT: 10 %
MPV: 9.8 fL (ref 7.5–12.5)
Monocytes Absolute: 1260 cells/uL — ABNORMAL HIGH (ref 200–950)
Neutro Abs: 8190 cells/uL — ABNORMAL HIGH (ref 1500–7800)
Neutrophils Relative %: 65 %
PLATELETS: 406 10*3/uL — AB (ref 140–400)
RBC: 4.42 MIL/uL (ref 3.80–5.10)
RDW: 14.4 % (ref 11.0–15.0)
WBC: 12.6 10*3/uL — ABNORMAL HIGH (ref 4.0–10.5)

## 2016-05-16 LAB — POCT URINALYSIS DIPSTICK
BILIRUBIN UA: NEGATIVE
Glucose, UA: NEGATIVE
KETONES UA: NEGATIVE
Leukocytes, UA: NEGATIVE
Nitrite, UA: NEGATIVE
PH UA: 6
Protein, UA: NEGATIVE
Spec Grav, UA: >=1.03
Urobilinogen, UA: NEGATIVE

## 2016-05-16 MED ORDER — CYCLOBENZAPRINE HCL 10 MG PO TABS: 10.0000 mg | ORAL_TABLET | Freq: Three times a day (TID) | ORAL | 0 refills | Status: DC | PRN

## 2016-05-16 MED ORDER — DICLOFENAC SODIUM 75 MG PO TBEC
75.0000 mg | DELAYED_RELEASE_TABLET | Freq: Two times a day (BID) | ORAL | 0 refills | Status: DC
Start: 2016-05-16 — End: 2016-05-27

## 2016-05-16 NOTE — Progress Notes (Signed)
Subjective:    Patient ID: Julie Guerrero, female    DOB: 24-Aug-1975, 40 y.o.   MRN: [id number]  HPI Chief Complaint  Patient presents with  . back pain    back pain on both side- constant pain, shooting pain- can't stand more than 15 mins at a time. plum size knot on top of vaginal area   She is new to the practice and here to establish care and for an acute complaint. No PCP in years. Goes to the ED.   Complains of ongoing back pain for 1 year, was having it once per week and now having back pain more often. Pain is with standing on her feet. States initiallly pain was only in the right low back but over the past few weeks pain is not to bilateral low back.   Complains that pain that feels like an ache, throbbing, and has occasional tenderness. Non radiating.  No numbness, tingling. Some weakness to her right leg occasionally. States this occurs once per month and the last time was several weeks ago.  Denies loss of control of bowels and bladder.   Denies history of back injury or surgery.  States she has taken Advil, 6 pills twice a day for past several months. Also tried topical Romeo AppleBen gay, salon pas, icy hot spray and a heating pad.   Denies fever, chills, nausea, vomiting, diarrhea. No history of GI upset with NSAIDS.  Smokes, rarely drinks alcohol.   PMH: denies  Family Hx: MGM with thyroid disease, grandparents with heart disease, HTN- mother.   Reviewed allergies, medications, past medical, surgical, family, and social history.   Review of Systems Pertinent positives and negatives in the history of present illness.     Objective:   Physical Exam  Constitutional: She is oriented to person, place, and time. She appears well-developed and well-nourished. No distress.  Neck: Normal range of motion. Neck supple.  Cardiovascular: Normal rate, regular rhythm and normal heart sounds.  Exam reveals no gallop and no friction rub.   No murmur heard. Pulmonary/Chest: Effort  normal and breath sounds normal.  Musculoskeletal:       Lumbar back: She exhibits tenderness and pain. She exhibits normal range of motion, no bony tenderness and no swelling.       Back:  Tenderness over SI notch without erythema, edema. Pain with certain ROM including hyperextension and left lateral bending and twisting. Bilateral LE neurovascularly intact.  Negative straight leg raise.   Lymphadenopathy:    She has no cervical adenopathy.  Neurological: She is alert and oriented to person, place, and time. She has normal reflexes. No cranial nerve deficit. Coordination normal.  Skin: Skin is warm and dry. No rash noted. No erythema. No pallor.  Psychiatric: She has a normal mood and affect. Her behavior is normal.   BP 140/90   Pulse 69   Temp 98.1 F (36.7 C) (Oral)   Wt 257 lb 9.6 oz (116.8 kg)   BMI 40.35 kg/m    Urine : 2+ blood (asymptomatic)    Assessment & Plan:  Bilateral low back pain without sciatica, unspecified chronicity - Plan: DG Lumbar Spine Complete, CBC with Differential/Platelet, POCT urinalysis dipstick, diclofenac (VOLTAREN) 75 MG EC tablet, cyclobenzaprine (FLEXERIL) 10 MG tablet, Urine culture  Encounter to establish care  Hematuria, unspecified type - Plan: Urine Microscopic, Urine culture  Review of her chart shows that she has had multiple visits to the ED for low back pain. She has not had  imaging of lumbar spine. Will order XR and prescribe pain medication.  She is neurologically intact, no red flags for back pain present.  2+ blood in urine. Will send for culture. No urinary symptoms.  Follow up pending labs and XR or in 2 weeks.

## 2016-05-16 NOTE — Patient Instructions (Addendum)

## 2016-05-17 LAB — URINALYSIS, MICROSCOPIC ONLY
BACTERIA UA: NONE SEEN [HPF]
Casts: NONE SEEN [LPF]
Crystals: NONE SEEN [HPF]
SQUAMOUS EPITHELIAL / LPF: NONE SEEN [HPF] (ref <=5)
WBC UA: NONE SEEN WBC/HPF (ref <=5)
Yeast: NONE SEEN [HPF]

## 2016-05-19 LAB — URINE CULTURE

## 2016-05-20 ENCOUNTER — Ambulatory Visit
Admission: RE | Admit: 2016-05-20 | Discharge: 2016-05-20 | Disposition: A | Discharge status: Home / Self Care | Payer: 59 | Source: Ambulatory Visit | Attending: Family Medicine | Admitting: Family Medicine

## 2016-05-20 ENCOUNTER — Other Ambulatory Visit: Payer: Self-pay | Admitting: Family Medicine

## 2016-05-20 DIAGNOSIS — M545 Low back pain, unspecified: Secondary | ICD-10-CM

## 2016-05-27 ENCOUNTER — Other Ambulatory Visit: Payer: Self-pay | Admitting: Family Medicine

## 2016-05-27 DIAGNOSIS — M545 Low back pain, unspecified: Secondary | ICD-10-CM

## 2016-05-27 NOTE — Telephone Encounter (Signed)
Pt has an appt on 06-04-16 @ 1:45pm with Dr[facility] [address].  I have refilled the diclofenac for pt but denied flexeril

## 2016-05-27 NOTE — Telephone Encounter (Signed)
Is this okay to refill? 

## 2016-05-27 NOTE — Telephone Encounter (Signed)
Please call and find out when her appointment is with [facility]. Ok to refill diclofenac only. Let's have her see ortho and get their opinion on back pain.

## 2016-06-18 ENCOUNTER — Ambulatory Visit: Payer: 59 | Attending: Sports Medicine | Admitting: Physical Therapy

## 2016-06-18 DIAGNOSIS — M6281 Muscle weakness (generalized): Secondary | ICD-10-CM

## 2016-06-18 DIAGNOSIS — M5440 Lumbago with sciatica, unspecified side: Secondary | ICD-10-CM

## 2016-06-18 NOTE — Patient Instructions (Signed)
Supine Pelvic Tilt (Active)    While lying on back with knees bent, pull abdomen in and up and flatten back. Hold ___ seconds. Relax. Complete ___ sets of ___ repetitions. Perform ___ sessions per day.  Copyright  VHI. All rights reserved.  Pelvic Tilt: Posterior (Standing)    With knees slightly bent, tighten stomach and flatten back by rolling pelvis down. Hold ____ seconds. Relax. Repeat ____ times per set. Do ____ sets per session. Do ____ sessions per day.  [email or web address].exer.us/207      Knee to Chest    Lying supine, bend involved knee to chest __3_ times.30 sec hold  Repeat with other leg. Do __2-3_ times per day.  Copyright  VHI. All rights reserved.  Copyright  VHI. All rights reserved.     Gave sidelying stretch for painful side off bed x 1 min x 1 per day

## 2016-06-21 NOTE — Therapy (Signed)
[facility] [address] [place], [place], [address] Phone: [phone]   Fax:  [phone]  Physical Therapy Evaluation  Patient Details  Name: Julie BorgVeronica M Guerrero MRN: [id number] Date of Birth: 1975/12/25 Referring Provider: Delton SeeBarnes, Kenneth   Encounter Date: 06/18/2016    Past Medical History:  Diagnosis Date  . Chronic low back pain     No past surgical history on file.  There were no vitals filed for this visit.       PT Education - 06/21/16 0829    Education provided Yes   Education Details PT/POC, HEP    Person(s) Educated Patient   Methods Explanation   Comprehension Verbalized understanding          PT Short Term Goals - 06/21/16 1551      PT SHORT TERM GOAL #1   Title Independent with initial HEP   Time 4   Period Weeks   Status New     PT SHORT TERM GOAL #2   Title Be able to stand for work activities with 25% less pain, difficulty.     Time 4   Period Weeks   Status New     PT SHORT TERM GOAL #3   Title Pt will be able to report min pain referral to Rt leg (improved by 25%)    Time 4   Period Weeks   Status New           PT Long Term Goals - 06/21/16 1552      PT LONG TERM GOAL #1   Title FOTO score improved to 45% impaired    Time 8   Period Weeks   Status New     PT LONG TERM GOAL #2   Title Be able to stand for work duties and pain <5/10 most of the time.    Time 8   Period Weeks   Status New     PT LONG TERM GOAL #3   Title Independent with more advanced HEP   Time 8   Period Weeks   Status New     PT LONG TERM GOAL #4   Title Demo and understand body mechanics, lifting as it relates to her job, ADLs.    Time 8   Period Weeks   Status New               Plan - 06/21/16 0830    Clinical Impression Statement Patient presents for low complexity eval of lumbar and SIJ pain which has become more of issue related to work and home tasks.  She has L sided pain but  radiating pain present in Rt. LE.  Signs and symptoms consistent with XR finding of spondylolisthesis.     Rehab Potential Excellent   PT Frequency 2x / week   PT Duration 8 weeks   PT Treatment/Interventions ADLs/Self Care Home Management;Cryotherapy;Electrical Stimulation;Iontophoresis 4mg /ml Dexamethasone;Functional mobility training;Patient/family education;Passive range of motion;Gait training;Neuromuscular re-education;Manual techniques;Therapeutic exercise;Ultrasound;Traction;Therapeutic activities   PT Next Visit Plan check HEp and progress to flexion based core, modalities as needed    PT Home Exercise Plan post pelvic tilt, knee to chest    Consulted and Agree with Plan of Care Patient      Patient will benefit from skilled therapeutic intervention in order to improve the following deficits and impairments:  Decreased strength, Decreased mobility, Postural dysfunction, Improper body mechanics, Impaired flexibility, Decreased balance, Decreased activity tolerance, Pain, Difficulty walking, Increased fascial restricitons, Decreased range of motion, Impaired  sensation, Obesity  Visit Diagnosis: Low back pain with sciatica, sciatica laterality unspecified, unspecified back pain laterality, unspecified chronicity  Muscle weakness (generalized)     Problem List There are no active problems to display for this patient.   Julie Guerrero 06/21/2016, 3:57 PM  [facility] [address] [place], [place], [address] Phone: [phone]   Fax:  [phone]  Name: Julie BorgVeronica M Guerrero MRN: [id number] Date of Birth: October 17, 1975  Julie MainlandJennifer Kaniah Guerrero, PT 06/21/16 3:57 PM Phone: [phone] Fax: [phone]

## 2016-06-25 ENCOUNTER — Ambulatory Visit: Payer: 59

## 2016-06-25 DIAGNOSIS — M6281 Muscle weakness (generalized): Secondary | ICD-10-CM

## 2016-06-25 DIAGNOSIS — M5440 Lumbago with sciatica, unspecified side: Secondary | ICD-10-CM | POA: Diagnosis not present

## 2016-06-25 NOTE — Patient Instructions (Addendum)
From cabinet added post pelvic tilt with hip flex bent knee and clams daily 3-10 reps RT and LT leg.  Piriformis stretch. 30 sec x 3 LT..         I asked her to stop the short bridge exercise she was doing as she reported increased tightness after early exercises

## 2016-06-25 NOTE — Therapy (Addendum)
[place], [place], [address] Phone: [phone]   Fax:  [phone]  Physical Therapy Treatment/Discharge  Patient Details  Name: Julie Guerrero MRN: [id number] Date of Birth: Dec 21, 1975 Referring Provider: Verda Cumins   Encounter Date: 06/25/2016      PT End of Session - 06/25/16 1630    Visit Number 2   Number of Visits 16   Date for PT Re-Evaluation 08/13/16   PT Start Time 0345   PT Stop Time 0440   PT Time Calculation (min) 55 min   Activity Tolerance Patient tolerated treatment well;No increased pain   Behavior During Therapy WFL for tasks assessed/performed      Past Medical History:  Diagnosis Date  . Chronic low back pain     No past surgical history on file.  There were no vitals filed for this visit.      Subjective Assessment - 06/25/16 1551    Subjective She reports no changes since eval.   She did her HEP 1x/day.    Currently in Pain? Yes   Pain Score 4    Pain Location Back   Pain Orientation Posterior;Left   Pain Descriptors / Indicators Sore;Aching;Discomfort   Pain Type Chronic pain   Pain Radiating Towards o leg pain , some at work standing   Pain Onset More than a month ago   Pain Frequency Constant   Aggravating Factors  stand /walk   Pain Relieving Factors sit , lean forward   Multiple Pain Sites No                         [facility] Adult PT Treatment/Exercise - 06/25/16 0001      Lumbar Exercises: Stretches   Single Knee to Chest Stretch 2 reps;30 seconds  RT and LT   Piriformis Stretch 3 reps;30 seconds     Lumbar Exercises: Supine   Other Supine Lumbar Exercises Posterior pelvic tilt  with clam x 5 reps  3 sets    Other Supine Lumbar Exercises Reviewed supine exercises and she was able to do the posterior pelvic tilt x 30 sec x 5 .        She also did  a short bridge she reports she does for 60 seconds that was not on the list she  states she was given.      Modalities   Modalities Electrical Stimulation;Moist Heat     Moist Heat Therapy   Number Minutes Moist Heat 15 Minutes   Moist Heat Location Lumbar Spine     Electrical Stimulation   Electrical Stimulation Location LT lumbar spine   Electrical Stimulation Action IFC   Electrical Stimulation Parameters L12    Electrical Stimulation Goals Pain     Manual Therapy   Manual Therapy Manual Traction   Manual Traction TO lt leg Gr 4 with oscillations  and SLR  x 12 reps                PT Education - 06/25/16 1702    Education provided Yes   Education Details stab exercises and piriformis stretch and to stop short bridge execises   Person(s) Educated Patient   Methods Explanation;Tactile cues;Verbal cues;Handout   Comprehension Returned demonstration;Verbalized understanding          PT Short Term Goals - 06/21/16 1551      PT SHORT TERM GOAL #1   Title Independent with initial HEP   Time  4   Period Weeks   Status New     PT SHORT TERM GOAL #2   Title Be able to stand for work activities with 25% less pain, difficulty.     Time 4   Period Weeks   Status New     PT SHORT TERM GOAL #3   Title Pt will be able to report min pain referral to Rt leg (improved by 25%)    Time 4   Period Weeks   Status New           PT Long Term Goals - 06/21/16 1552      PT LONG TERM GOAL #1   Title FOTO score improved to 45% impaired    Time 8   Period Weeks   Status New     PT LONG TERM GOAL #2   Title Be able to stand for work duties and pain <5/10 most of the time.    Time 8   Period Weeks   Status New     PT LONG TERM GOAL #3   Title Independent with more advanced HEP   Time 8   Period Weeks   Status New     PT LONG TERM GOAL #4   Title Demo and understand body mechanics, lifting as it relates to her job, ADLs.    Time 8   Period Weeks   Status New               Plan - 06/25/16 1549    Clinical Impression Statement  No improvement so far . Manual pull to LT leg decreased pressure and pain but was still throbbing .      PT Treatment/Interventions ADLs/Self Care Home Management;Cryotherapy;Electrical Stimulation;Iontophoresis 53m/ml Dexamethasone;Functional mobility training;Patient/family education;Passive range of motion;Gait training;Neuromuscular re-education;Manual techniques;Therapeutic exercise;Ultrasound;Traction;Therapeutic activities   PT Next Visit Plan check HEP and progress to flexion based core, modalities as needed    PT Home Exercise Plan post pelvic tilt, knee to chest , piriformis, post tilt with bent leg raise and clam   Consulted and Agree with Plan of Care Patient      Patient will benefit from skilled therapeutic intervention in order to improve the following deficits and impairments:  Decreased strength, Decreased mobility, Postural dysfunction, Improper body mechanics, Impaired flexibility, Decreased balance, Decreased activity tolerance, Pain, Difficulty walking, Increased fascial restricitons, Decreased range of motion, Impaired sensation, Obesity  Visit Diagnosis: Low back pain with sciatica, sciatica laterality unspecified, unspecified back pain laterality, unspecified chronicity  Muscle weakness (generalized)     Problem List There are no active problems to display for this patient.   CDarrel Hoover PT 06/25/2016, 5:05 PM  [facility] [address]Phone: [phone]  Fax:  [phone] Name: Julie BOGOSIANMRN: [id number] of Birth: [birth year] PHYSICAL THERAPY DISCHARGE SUMMARY  Visits from Start of Care: 2 Current functional level related to goals / functional outcomes: Unknown as she did not return after second visit. She was called and did not return contact.    Remaining deficits:  Unknown   Education / Equipment: NA Plan:                                                     Patient goals were not met.  Patient is being discharged due to not returning since the last visit.  ?????    Lillette Boxer Dollene Mallery  PT   08/13/16    10:57 AM

## 2016-06-26 ENCOUNTER — Ambulatory Visit: Payer: 59 | Admitting: Physical Therapy

## 2016-07-02 ENCOUNTER — Ambulatory Visit: Payer: 59

## 2016-07-09 ENCOUNTER — Ambulatory Visit: Payer: 59

## 2016-07-10 ENCOUNTER — Telehealth: Payer: Self-pay | Admitting: Physical Therapy

## 2016-07-10 NOTE — Telephone Encounter (Signed)
Left message regarding two no-show appointments. Asked for patient to return call and cancel or confirm her next appointment for 07/11/2016.

## 2016-07-11 ENCOUNTER — Ambulatory Visit: Payer: 59 | Admitting: Physical Therapy

## 2016-07-16 ENCOUNTER — Ambulatory Visit: Payer: 59 | Admitting: Physical Therapy

## 2016-09-26 ENCOUNTER — Encounter: Payer: 59 | Admitting: Family Medicine

## 2016-10-02 ENCOUNTER — Encounter: Payer: 59 | Admitting: Family Medicine

## 2016-12-11 ENCOUNTER — Other Ambulatory Visit: Payer: Self-pay | Admitting: Family Medicine

## 2016-12-11 DIAGNOSIS — R1907 Generalized intra-abdominal and pelvic swelling, mass and lump: Secondary | ICD-10-CM

## 2016-12-17 ENCOUNTER — Other Ambulatory Visit: Payer: 59

## 2016-12-25 ENCOUNTER — Ambulatory Visit
Admission: RE | Admit: 2016-12-25 | Discharge: 2016-12-25 | Disposition: A | Discharge status: Home / Self Care | Payer: 59 | Source: Ambulatory Visit | Attending: Family Medicine | Admitting: Family Medicine

## 2016-12-25 ENCOUNTER — Other Ambulatory Visit: Payer: Self-pay | Admitting: Family Medicine

## 2016-12-25 DIAGNOSIS — R1900 Intra-abdominal and pelvic swelling, mass and lump, unspecified site: Secondary | ICD-10-CM

## 2016-12-25 DIAGNOSIS — R1907 Generalized intra-abdominal and pelvic swelling, mass and lump: Secondary | ICD-10-CM

## 2018-02-11 ENCOUNTER — Encounter (HOSPITAL_COMMUNITY): Payer: Self-pay

## 2018-02-11 ENCOUNTER — Emergency Department (HOSPITAL_COMMUNITY)
Admission: EM | Admit: 2018-02-11 | Discharge: 2018-02-11 | Disposition: A | Discharge status: Home / Self Care | Payer: BLUE CROSS/BLUE SHIELD | Attending: Emergency Medicine | Admitting: Emergency Medicine

## 2018-02-11 ENCOUNTER — Other Ambulatory Visit: Payer: Self-pay

## 2018-02-11 DIAGNOSIS — M545 Low back pain, unspecified: Secondary | ICD-10-CM

## 2018-02-11 DIAGNOSIS — F17210 Nicotine dependence, cigarettes, uncomplicated: Secondary | ICD-10-CM | POA: Insufficient documentation

## 2018-02-11 MED ORDER — KETOROLAC TROMETHAMINE 15 MG/ML IJ SOLN
15.0000 mg | Freq: Once | INTRAMUSCULAR | Status: AC
  Administered 2018-02-11: 15 mg via INTRAMUSCULAR
  Filled 2018-02-11: qty 1

## 2018-02-11 MED ORDER — METHOCARBAMOL 500 MG PO TABS: 1000.0000 mg | ORAL_TABLET | Freq: Four times a day (QID) | ORAL | 0 refills | Status: DC

## 2018-02-11 MED ORDER — PREDNISONE 20 MG PO TABS: ORAL_TABLET | ORAL | 0 refills | Status: DC

## 2018-02-11 MED ORDER — NAPROXEN 500 MG PO TABS: 500.0000 mg | ORAL_TABLET | Freq: Two times a day (BID) | ORAL | 0 refills | Status: DC

## 2018-02-11 MED ORDER — OXYCODONE-ACETAMINOPHEN 5-325 MG PO TABS
1.0000 | ORAL_TABLET | Freq: Once | ORAL | Status: AC
  Administered 2018-02-11: 1 via ORAL
  Filled 2018-02-11: qty 1

## 2018-02-11 NOTE — ED Provider Notes (Signed)
[facility]   CSN: [id number] Arrival date & time: 02/11/18  0544     History   Chief Complaint Chief Complaint  Patient presents with  . Back Pain    HPI Julie Guerrero is a 42 y.o. female.  Patient with history of low back pain presents with complaint of left lower back pain starting about 3 days ago.  There is no inciting event.  Patient is working her usual job.  Pain is described as a throbbing and burning in her left lower back which sometimes radiates into her buttock.  It does not shoot into her leg however she does feel like she has to "pick up" her leg when she walks sometimes.  She has not tripped or fallen.  Symptoms are similar to previous.  Patient was concerned because it started so abruptly. Patient denies warning symptoms of back pain including: fecal incontinence, urinary retention or overflow incontinence, night sweats, waking from sleep with back pain, unexplained fevers or weight loss, h/o cancer, IVDU, recent trauma.  She has been applying ice and heat at home and taking ibuprofen without much improvement.      Past Medical History:  Diagnosis Date  . Chronic low back pain     There are no active problems to display for this patient.   Past Surgical History:  Procedure Laterality Date  . TUBAL LIGATION       OB History   None      Home Medications    Prior to Admission medications   Medication Sig Start Date End Date Taking? Authorizing Provider  methocarbamol (ROBAXIN) 500 MG tablet Take 2 tablets (1,000 mg total) by mouth 4 (four) times daily. 02/11/18   Renne CriglerGeiple, Sanaz Scarlett, PA-C  naproxen (NAPROSYN) 500 MG tablet Take 1 tablet (500 mg total) by mouth 2 (two) times daily. 02/11/18   Renne CriglerGeiple, Rima Blizzard, PA-C  predniSONE (DELTASONE) 20 MG tablet 3 Tabs PO Days 1-3, then 2 tabs PO Days 4-6, then 1 tab PO Day 7-9, then Half Tab PO Day 10-12 02/11/18   Renne CriglerGeiple, Gregery Walberg, PA-C    Family History Family History    Problem Relation Age of Onset  . Hypertension Mother   . Thyroid disease Maternal Grandmother     Social History Social History   Tobacco Use  . Smoking status: Current Every Day Smoker    Packs/day: 0.25    Types: Cigarettes  . Smokeless tobacco: Never Used  Substance Use Topics  . Alcohol use: Yes    Comment: occ  . Drug use: No     Allergies   Patient has no known allergies.   Review of Systems Review of Systems  Constitutional: Negative for fever and unexpected weight change.  Gastrointestinal: Negative for constipation.       Negative for fecal incontinence.   Genitourinary: Negative for dysuria, flank pain and hematuria.       Negative for urinary incontinence or retention.  Musculoskeletal: Positive for back pain.  Neurological: Positive for weakness (Subjective). Negative for numbness.       Denies saddle paresthesias.     Physical Exam Updated Vital Signs BP (!) 166/97 (BP Location: Right Arm)   Pulse 67   Temp 97.9 F (36.6 C) (Oral)   Resp 17   Ht 5\' 7"  (1.702 m)   Wt 108.9 kg (240 lb)   LMP 01/27/2018   SpO2 99%   BMI 37.59 kg/m   Physical Exam  Constitutional: She appears well-developed  and well-nourished.  HENT:  Head: Normocephalic and atraumatic.  Eyes: Conjunctivae are normal.  Neck: Normal range of motion. Neck supple.  Pulmonary/Chest: Effort normal.  Abdominal: Soft. There is no tenderness. There is no CVA tenderness.  Musculoskeletal: Normal range of motion.  No step-off noted with palpation of spine.   Neurological: She is alert. She has normal strength and normal reflexes. No sensory deficit.  5/5 strength in entire lower extremities bilaterally. No sensation deficit.   Skin: Skin is warm and dry. No rash noted.  Psychiatric: She has a normal mood and affect.  Nursing note and vitals reviewed.    ED Treatments / Results  Labs (all labs ordered are listed, but only abnormal results are displayed) Labs Reviewed - No data  to display  EKG None  Radiology No results found.  Procedures Procedures (including critical care time)  Medications Ordered in ED Medications  ketorolac (TORADOL) 15 MG/ML injection 15 mg (has no administration in time range)  oxyCODONE-acetaminophen (PERCOCET/ROXICET) 5-325 MG per tablet 1 tablet (has no administration in time range)     Initial Impression / Assessment and Plan / ED Course  I have reviewed the triage vital signs and the nursing notes.  Pertinent labs & imaging results that were available during my care of the patient were reviewed by me and considered in my medical decision making (see chart for details).     7:01 AM Patient seen and examined. Work-up initiated. Medications ordered.   Vital signs reviewed and are as follows: Vitals:   02/11/18 0552  BP: (!) 166/97  Pulse: 67  Resp: 17  Temp: 97.9 F (36.6 C)  SpO2: 99%   Will give IM toradol and PO percocet here for pain. Home with prednisone, Robaxin, naproxen.  No red flag s/s of low back pain. Patient was counseled on back pain precautions and told to do activity as tolerated but do not lift, push, or pull heavy objects more than 10 pounds for the next week.  Patient counseled to use ice or heat on back for no longer than 15 minutes every hour.   Patient counseled on proper use of muscle relaxant medication.  They were told not to drink alcohol, drive any vehicle, or do any dangerous activities while taking this medication.  Patient verbalized understanding.  Patient urged to follow-up with PCP if pain does not improve with treatment and rest or if pain becomes recurrent. Urged to return with worsening severe pain, loss of bowel or bladder control, trouble walking.   The patient verbalizes understanding and agrees with the plan.    Final Clinical Impressions(s) / ED Diagnoses   Final diagnoses:  Acute left-sided low back pain without sciatica   Patient with back pain, similar to previous  epsiodes.  Current episode < 1 week. No neurological deficits on exam. Patient is ambulatory without foot drop. No warning symptoms of back pain including: fecal incontinence, urinary retention or overflow incontinence, night sweats, waking from sleep with back pain, unexplained fevers or weight loss, h/o cancer, IVDU, recent trauma. No concern for cauda equina, epidural abscess, or other serious cause of back pain. Conservative measures such as rest, ice/heat and pain medicine indicated with PCP follow-up if no improvement with conservative management.    ED Discharge Orders        Ordered    naproxen (NAPROSYN) 500 MG tablet  2 times daily     02/11/18 0650    methocarbamol (ROBAXIN) 500 MG tablet  4 times daily  02/11/18 0650    predniSONE (DELTASONE) 20 MG tablet     02/11/18 0650       Renne Crigler, PA-C 02/11/18 1610    Geoffery Lyons, MD 02/11/18 2259

## 2018-02-11 NOTE — ED Triage Notes (Signed)
Pt arrives today due to lower left back pain. She arrives today due to worsening pain over the past week. Pt took Advil without any relief of pain. Pt is ambulatory.

## 2018-02-11 NOTE — Discharge Instructions (Signed)
Please read and follow all provided instructions.  Your diagnoses today include:  1. Acute left-sided low back pain without sciatica     Tests performed today include:  Vital signs - see below for your results today  Medications prescribed:   Naproxen - anti-inflammatory pain medication  Do not exceed 500mg  naproxen every 12 hours, take with food  You have been prescribed an anti-inflammatory medication or NSAID. Take with food. Take smallest effective dose for the shortest duration needed for your pain. Stop taking if you experience stomach pain or vomiting.    Robaxin (methocarbamol) - muscle relaxer medication  DO NOT drive or perform any activities that require you to be awake and alert because this medicine can make you drowsy.    Prednisone - steroid medicine   It is best to take this medication in the morning to prevent sleeping problems. If you are diabetic, monitor your blood sugar closely and stop taking Prednisone if blood sugar is over 300. Take with food to prevent stomach upset.   Take any prescribed medications only as directed.  Home care instructions:   Follow any educational materials contained in this packet  Please rest, use ice or heat on your back for the next several days  Do not lift, push, pull anything more than 10 pounds for the next week  Follow-up instructions: Please follow-up with your primary care provider in the next 1 week for further evaluation of your symptoms.   Return instructions:  SEEK IMMEDIATE MEDICAL ATTENTION IF YOU HAVE:  New numbness, tingling, weakness, or problem with the use of your arms or legs  Severe back pain not relieved with medications  Loss control of your bowels or bladder  Increasing pain in any areas of the body (such as chest or abdominal pain)  Shortness of breath, dizziness, or fainting.   Worsening nausea (feeling sick to your stomach), vomiting, fever, or sweats  Any other emergent concerns  regarding your health   Additional Information:  Your vital signs today were: BP (!) 166/97 (BP Location: Right Arm)    Pulse 67    Temp 97.9 F (36.6 C) (Oral)    Resp 17    Ht 5\' 7"  (1.702 m)    Wt 108.9 kg (240 lb)    LMP 01/27/2018    SpO2 99%    BMI 37.59 kg/m  If your blood pressure (BP) was elevated above 135/85 this visit, please have this repeated by your doctor within one month. --------------

## 2018-02-20 DIAGNOSIS — M4316 Spondylolisthesis, lumbar region: Secondary | ICD-10-CM | POA: Diagnosis not present

## 2018-02-20 DIAGNOSIS — M545 Low back pain: Secondary | ICD-10-CM | POA: Diagnosis not present

## 2018-02-20 DIAGNOSIS — M5136 Other intervertebral disc degeneration, lumbar region: Secondary | ICD-10-CM | POA: Diagnosis not present

## 2018-03-11 DIAGNOSIS — M5136 Other intervertebral disc degeneration, lumbar region: Secondary | ICD-10-CM | POA: Diagnosis not present

## 2018-03-11 DIAGNOSIS — M4316 Spondylolisthesis, lumbar region: Secondary | ICD-10-CM | POA: Diagnosis not present

## 2018-03-11 DIAGNOSIS — G894 Chronic pain syndrome: Secondary | ICD-10-CM | POA: Diagnosis not present

## 2018-03-11 DIAGNOSIS — Z79899 Other long term (current) drug therapy: Secondary | ICD-10-CM | POA: Diagnosis not present

## 2018-03-11 DIAGNOSIS — Z79891 Long term (current) use of opiate analgesic: Secondary | ICD-10-CM | POA: Diagnosis not present

## 2019-01-15 ENCOUNTER — Other Ambulatory Visit: Payer: Self-pay

## 2019-01-15 ENCOUNTER — Encounter: Payer: Self-pay | Admitting: Medical

## 2019-01-15 ENCOUNTER — Ambulatory Visit (INDEPENDENT_AMBULATORY_CARE_PROVIDER_SITE_OTHER): Payer: BC Managed Care – PPO | Admitting: Medical

## 2019-01-15 VITALS — BP 138/80 | HR 80 | Temp 98.8°F | Resp 16 | Ht 66.5 in | Wt 275.0 lb

## 2019-01-15 DIAGNOSIS — Z01818 Encounter for other preprocedural examination: Secondary | ICD-10-CM | POA: Diagnosis not present

## 2019-01-15 DIAGNOSIS — K4090 Unilateral inguinal hernia, without obstruction or gangrene, not specified as recurrent: Secondary | ICD-10-CM | POA: Diagnosis not present

## 2019-01-15 DIAGNOSIS — K5909 Other constipation: Secondary | ICD-10-CM

## 2019-01-15 DIAGNOSIS — L309 Dermatitis, unspecified: Secondary | ICD-10-CM | POA: Diagnosis not present

## 2019-01-15 MED ORDER — LINACLOTIDE 72 MCG PO CAPS: 72.0000 ug | ORAL_CAPSULE | Freq: Every day | ORAL | 0 refills | Status: DC

## 2019-01-15 MED ORDER — TRIAMCINOLONE ACETONIDE 0.1 % EX CREA: 1.0000 "application " | TOPICAL_CREAM | Freq: Two times a day (BID) | CUTANEOUS | 0 refills | Status: DC

## 2019-01-15 NOTE — Progress Notes (Signed)
Subjective:  Julie Guerrero is a 43 y.o. female who presents for Chief Complaint  Patient presents with  . right side pain    lower abdominal pain ? hernia right side X 1 year constant 1.5 weeks     Here for possible hernia.   She reports a bulge and pain in right inguinal area.   Has felt a bulge in that area for past year, but in last 1.5 weeks has large bulge that is constantly painful now.  Even awakes her in her sleep.  Was getting intermittent pains but not steady daily pain.     She denies fever, body aches, chills, no bowel changes no urine changes, no vaginal symptoms, no blood in the stool or urine, no weight changes.  She does report having some constipation issues bowel movements on average 3 times per week.  Not using anything for this.  She has rash long term on right wrist, rough, uses OTC hydrocortisone without improvement.  Thinks its eczema.   No other aggravating or relieving factors.    No other c/o.  Past Medical History:  Diagnosis Date  . Chronic low back pain   . Obesity    Current Outpatient Medications on File Prior to Visit  Medication Sig Dispense Refill  . naproxen (NAPROSYN) 500 MG tablet Take 1 tablet (500 mg total) by mouth 2 (two) times daily. 20 tablet 0   No current facility-administered medications on file prior to visit.     The following portions of the patient's history were reviewed and updated as appropriate: allergies, current medications, past family history, past medical history, past social history, past surgical history and problem list.  ROS Otherwise as in subjective above    Objective: BP 138/80   Pulse 80   Temp 98.8 F (37.1 C) (Oral)   Resp 16   Ht 5' 6.5" (1.689 m)   Wt 275 lb (124.7 kg)   LMP 12/29/2018 (Exact Date)   SpO2 98%   BMI 43.72 kg/m   Wt Readings from Last 3 Encounters:  01/15/19 275 lb (124.7 kg)  02/11/18 240 lb (108.9 kg)  05/16/16 257 lb 9.6 oz (116.8 kg)   General appearance: alert, no  distress, well developed, well nourished, obese AA female Oral cavity: MMM, no lesions Neck: supple, no lymphadenopathy, no thyromegaly, no masses Heart: RRR, normal S1, S2, no murmurs Lungs: CTA bilaterally, no wheezes, rhonchi, or rales Abdomen: +bs, soft, non tender, non distended, no masses, no hepatomegaly, no splenomegaly Large right reducible inguinal hernia, tender to palpation at times.   No apparent strangulation Pulses: 2+ radial pulses, 2+ pedal pulses, normal cap refill Ext: no edema, + moderate LE varicose veins Skin: right medial wrist with 4cm x 4cm patch of pink/red coloration, rough skin suggestive of eczema   EKG Indication preop Rate 59 bpm Sinus bradycardia PR 146ms QRS 76s QtC 467ms Axis 26 degrees No acute changes, nonspecific ST abnormality    Assessment: Encounter Diagnoses  Name Primary?  . Right inguinal hernia Yes  . Preop examination   . Chronic constipation   . Eczema, unspecified type      Plan: Right inguinal hernia - given the constant pain and size, referral to general surgery urgently  preop - she hasn't been here in a few years.  Since she will be having surgery, labs, EKG and eval today  chronic constipation - begin trial of Linzess, discussed water intake, fiber intake, prevention  Eczema - use daily moisturizing lotion, can  use TAC cream below for flare up  Julie Guerrero was seen today for right side pain.  Diagnoses and all orders for this visit:  Right inguinal hernia -     Ambulatory referral to General Surgery -     EKG 12-Lead -     Basic metabolic panel -     CBC -     Ambulatory referral to General Surgery  Preop examination -     EKG 12-Lead -     Basic metabolic panel -     CBC -     Ambulatory referral to General Surgery -     TSH  Chronic constipation -     TSH  Eczema, unspecified type  Other orders -     linaclotide (LINZESS) 72 MCG capsule; Take 1 capsule (72 mcg total) by mouth daily before  breakfast. -     triamcinolone cream (KENALOG) 0.1 %; Apply 1 application topically 2 (two) times daily.    Follow up: referral to general surgery

## 2019-01-16 LAB — CBC
Hematocrit: 35.4 % (ref 34.0–46.6)
Hemoglobin: 11.3 g/dL (ref 11.1–15.9)
MCH: 24.9 pg — ABNORMAL LOW (ref 26.6–33.0)
MCHC: 31.9 g/dL (ref 31.5–35.7)
MCV: 78 fL — ABNORMAL LOW (ref 79–97)
Platelets: 385 10*3/uL (ref 150–450)
RBC: 4.54 x10E6/uL (ref 3.77–5.28)
RDW: 13.6 % (ref 11.7–15.4)
WBC: 9 10*3/uL (ref 3.4–10.8)

## 2019-01-16 LAB — BASIC METABOLIC PANEL
BUN/Creatinine Ratio: 14 (ref 9–23)
BUN: 9 mg/dL (ref 6–24)
CO2: 20 mmol/L (ref 20–29)
Calcium: 9 mg/dL (ref 8.7–10.2)
Chloride: 106 mmol/L (ref 96–106)
Creatinine, Ser: 0.64 mg/dL (ref 0.57–1.00)
GFR calc Af Amer: 127 mL/min/{1.73_m2} (ref >59)
GFR calc non Af Amer: 110 mL/min/{1.73_m2} (ref >59)
Glucose: 105 mg/dL — ABNORMAL HIGH (ref 65–99)
Potassium: 4.8 mmol/L (ref 3.5–5.2)
Sodium: 141 mmol/L (ref 134–144)

## 2019-01-16 LAB — TSH: TSH: 2.39 u[IU]/mL (ref 0.450–4.500)

## 2019-01-21 DIAGNOSIS — K4090 Unilateral inguinal hernia, without obstruction or gangrene, not specified as recurrent: Secondary | ICD-10-CM | POA: Diagnosis not present

## 2019-01-26 ENCOUNTER — Other Ambulatory Visit: Payer: Self-pay | Admitting: Surgery

## 2019-01-26 DIAGNOSIS — K4090 Unilateral inguinal hernia, without obstruction or gangrene, not specified as recurrent: Secondary | ICD-10-CM

## 2019-02-17 ENCOUNTER — Other Ambulatory Visit: Payer: Self-pay

## 2019-02-17 ENCOUNTER — Encounter: Payer: Self-pay | Admitting: Family Medicine

## 2019-02-17 ENCOUNTER — Ambulatory Visit (INDEPENDENT_AMBULATORY_CARE_PROVIDER_SITE_OTHER): Payer: BC Managed Care – PPO | Admitting: Family Medicine

## 2019-02-17 VITALS — BP 124/80 | HR 64 | Temp 98.6°F | Ht 65.5 in | Wt 275.4 lb

## 2019-02-17 DIAGNOSIS — K4090 Unilateral inguinal hernia, without obstruction or gangrene, not specified as recurrent: Secondary | ICD-10-CM | POA: Insufficient documentation

## 2019-02-17 DIAGNOSIS — Z0000 Encounter for general adult medical examination without abnormal findings: Secondary | ICD-10-CM

## 2019-02-17 DIAGNOSIS — R87613 High grade squamous intraepithelial lesion on cytologic smear of cervix (HGSIL): Secondary | ICD-10-CM | POA: Diagnosis not present

## 2019-02-17 DIAGNOSIS — Z114 Encounter for screening for human immunodeficiency virus [HIV]: Secondary | ICD-10-CM

## 2019-02-17 DIAGNOSIS — Z1239 Encounter for other screening for malignant neoplasm of breast: Secondary | ICD-10-CM

## 2019-02-17 DIAGNOSIS — Z131 Encounter for screening for diabetes mellitus: Secondary | ICD-10-CM

## 2019-02-17 DIAGNOSIS — B977 Papillomavirus as the cause of diseases classified elsewhere: Secondary | ICD-10-CM | POA: Diagnosis not present

## 2019-02-17 NOTE — Progress Notes (Signed)
Subjective:    Patient ID: Julie Guerrero, female    DOB: September 26, 1975, 43 y.o.   MRN: [id number]  HPI Chief Complaint  Patient presents with  . fasting cpe    fasting cpe, last pap was 2011- abnormal, no other concerns   She is fairly new to the practice and here for a complete physical exam. Last CPE: years ago  States she has not had any regular medical care over the past few years but is ready to start taking better care of herself.  She does have a large inguinal hernia. States she was recently evaluated by surgery and they recommend that she lose 30 pounds before having surgery to repair this. Would like help with weight loss.  States she has tried multiple diets over the years and her weight has fluctuated.  States she was unaware that she had a Pap smear that was abnormal in 2011.  Once I started talking to her about the pathology report and the fact that she had an endometrial biopsy at that time, she did recall having this but is not sure why she did not follow-up. She was HPV positive in 2011 as well.  Other providers: General surgery   Social history: Lives with her fiance and son who is 1719, works at [employer] in [employer].  She smokes cigarettes some days but very few.  Alcohol use is socially.  Denies drug use. Diet: avoiding white food currently.  Excerise: treadmill 30 minutes/day x 3 days per week   Immunizations: [employer] employee   Health maintenance:  Mammogram: never  Colonoscopy: never  Last Gynecological Exam: ?2011 and abnormal.  Last Menstrual cycle: 01/29/19 Last Dental Exam: 2 years ago  Last Eye Exam: January 18 2019. She is getting glasses.   Wears seatbelt always, smoke detectors in home and functioning, does not text while driving and feels safe in home environment.   Reviewed allergies, medications, past medical, surgical, family, and social history.     Review of Systems Review of Systems Constitutional: -fever, -chills, -sweats, -unexpected weight  change,-fatigue ENT: -runny nose, -ear pain, -sore throat Cardiology:  -chest pain, -palpitations, -edema Respiratory: -cough, -shortness of breath, -wheezing Gastroenterology: -abdominal pain, -nausea, -vomiting, -diarrhea, -constipation  Hematology: -bleeding or bruising problems Musculoskeletal: -arthralgias, -myalgias, -joint swelling, -back pain Ophthalmology: -vision changes Urology: -dysuria, -difficulty urinating, -hematuria, -urinary frequency, -urgency Neurology: -headache, -weakness, -tingling, -numbness       Objective:   Physical Exam BP 124/80   Pulse 64   Temp 98.6 F (37 C) (Oral)   Ht 5' 5.5" (1.664 m)   Wt 275 lb 6.4 oz (124.9 kg)   LMP 01/29/2019   BMI 45.13 kg/m   General Appearance:    Alert, cooperative, no distress, appears stated age, morbidly obese  Head:    Normocephalic, without obvious abnormality, atraumatic  Eyes:    PERRL, conjunctiva/corneas clear, EOM's intact, fundi    benign  Ears:    Normal TM's and external ear canals  Nose:  Mask in place due to COVID 19  Throat:  Mask in place due to COVID-19  Neck:   Supple, no lymphadenopathy;  thyroid:  no   enlargement/tenderness/nodules; no carotid   bruit or JVD  Back:    Spine nontender, no curvature, ROM normal, no CVA     tenderness  Lungs:     Clear to auscultation bilaterally without wheezes, rales or     ronchi; respirations unlabored  Chest Wall:    No tenderness or  deformity   Heart:    Regular rate and rhythm, S1 and S2 normal, no murmur, rub   or gallop  Breast Exam:   Plans to see OB/GYN.  Mammogram ordered.  This will be her first 1.  Abdomen:     Soft, non-tender, nondistended, normoactive bowel sounds,    no masses, no hepatosplenomegaly  Genitalia:   Deferred.  Will refer to OB/GYN  Rectal:   Deferred  Extremities:  No clubbing, cyanosis or edema  Pulses:   2+ and symmetric all extremities  Skin:   Skin color, texture, turgor normal, no rashes or lesions  Lymph nodes:    Cervical, supraclavicular, and axillary nodes normal  Neurologic:   CNII-XII intact, normal strength, sensation and gait; reflexes 2+ and symmetric throughout          Psych:   Normal mood, affect, hygiene and grooming.        Assessment & Plan:  Routine general medical examination at a health care facility - Plan: Basic metabolic panel, Lipid panel.  She is a pleasant 43 year old female who has not had a CPE in years.  She appears to be doing well overall.  States immunizations are up-to-date.  She is overdue in several areas of preventive health care.  Discussed safety.  Hernia, inguinal, right - Plan: Being managed by general surgery.  Was advised that she should lose 30 pounds before having surgery.  High grade squamous intraepithelial lesion (HGSIL) on cytologic smear of cervix - Plan: She will call and schedule with an OB/GYN right away.  Declines having a Pap smear here in our office today.  HPV in female - Plan: She will call and schedule with an OB/GYN.  Screening for breast cancer - Plan: MM DIGITAL SCREENING BILATERAL, she will call and schedule mammogram.  This will be her first 1.  Screening for diabetes mellitus - Plan: Hemoglobin A1c, family history of diabetes and morbid obesity places her at increased risk.  Counseling on healthy diet exercise and weight loss to help prevent.  Morbid obesity ([place]) - Plan: Lipid panel, Amb Ref to Medical Weight Management.  Discussed that I will put in an urgent referral due to the need for weight loss in order for her to have a hernia repair.  Screening for HIV without presence of risk factors - Plan: HIV Antibody (routine testing w rflx), per guidelines.

## 2019-02-17 NOTE — Patient Instructions (Addendum)
I recommend that you stop smoking.   Call and schedule with an OB/GYN due to history of High grade lesion and HPV positive on your last pap smear in 2011. You need follow up very soon.   Call and schedule your mammogram at [facility].   You will receive a call from [facility].   Obgyn Offices:   [facility] [address] [place], [place] [phone]  [facility] Address: [address] [place] [place], [place] [address] Phone: [phone]  [facility] [address] [place], [place] [address] Phone: [phone]   [place],  [address] Phone: [phone]     Preventive Care 43-55 Years Old, Female Preventive care refers to visits with your health care provider and lifestyle choices that can promote health and wellness. This includes:  A yearly physical exam. This may also be called an annual well check.  Regular dental visits and eye exams.  Immunizations.  Screening for certain conditions.  Healthy lifestyle choices, such as eating a healthy diet, getting regular exercise, not using drugs or products that contain nicotine and tobacco, and limiting alcohol use. What can I expect for my preventive care visit? Physical exam Your health care provider will check your:  Height and weight. This may be used to calculate body mass index (BMI), which tells if you are at a healthy weight.  Heart rate and blood pressure.  Skin for abnormal spots. Counseling Your health care provider may ask you questions about your:  Alcohol, tobacco, and drug use.  Emotional well-being.  Home and relationship well-being.  Sexual activity.  Eating habits.  Work and work Statistician.  Method of birth control.  Menstrual cycle.  Pregnancy history. What immunizations do I need?  Influenza (flu) vaccine  This is  recommended every year. Tetanus, diphtheria, and pertussis (Tdap) vaccine  You may need a Td booster every 10 years. Varicella (chickenpox) vaccine  You may need this if you have not been vaccinated. Zoster (shingles) vaccine  You may need this after age 26. Measles, mumps, and rubella (MMR) vaccine  You may need at least one dose of MMR if you were born in 1957 or later. You may also need a second dose. Pneumococcal conjugate (PCV13) vaccine  You may need this if you have certain conditions and were not previously vaccinated. Pneumococcal polysaccharide (PPSV23) vaccine  You may need one or two doses if you smoke cigarettes or if you have certain conditions. Meningococcal conjugate (MenACWY) vaccine  You may need this if you have certain conditions. Hepatitis A vaccine  You may need this if you have certain conditions or if you travel or work in places where you may be exposed to hepatitis A. Hepatitis B vaccine  You may need this if you have certain conditions or if you travel or work in places where you may be exposed to hepatitis B. Haemophilus influenzae type b (Hib) vaccine  You may need this if you have certain conditions. Human papillomavirus (HPV) vaccine  If recommended by your health care provider, you may need three doses over 6 months. You may receive vaccines as individual doses or as more than one vaccine together in one shot (combination vaccines). Talk with your health care provider about the risks and benefits of combination vaccines. What tests do I need? Blood tests  Lipid and cholesterol levels. These may be checked every 5 years, or  more frequently if you are over 43 years old.  Hepatitis C test.  Hepatitis B test. Screening  Lung cancer screening. You may have this screening every year starting at age 43 if you have a 30-pack-year history of smoking and currently smoke or have quit within the past 15 years.  Colorectal cancer screening. All adults  should have this screening starting at age 43 and continuing until age 61. Your health care provider may recommend screening at age 35 if you are at increased risk. You will have tests every 1-10 years, depending on your results and the type of screening test.  Diabetes screening. This is done by checking your blood sugar (glucose) after you have not eaten for a while (fasting). You may have this done every 1-43 years.  Mammogram. This may be done every 1-43 years. Talk with your health care provider about when you should start having regular mammograms. This may depend on whether you have a family history of breast cancer.  BRCA-related cancer screening. This may be done if you have a family history of breast, ovarian, tubal, or peritoneal cancers.  Pelvic exam and Pap test. This may be done every 3 years starting at age 43. Starting at age 43, this may be done every 5 years if you have a Pap test in combination with an HPV test. Other tests  Sexually transmitted disease (STD) testing.  Bone density scan. This is done to screen for osteoporosis. You may have this scan if you are at high risk for osteoporosis. Follow these instructions at home: Eating and drinking  Eat a diet that includes fresh fruits and vegetables, whole grains, lean protein, and low-fat dairy.  Take vitamin and mineral supplements as recommended by your health care provider.  Do not drink alcohol if: ? Your health care provider tells you not to drink. ? You are pregnant, may be pregnant, or are planning to become pregnant.  If you drink alcohol: ? Limit how much you have to 0-1 drink a day. ? Be aware of how much alcohol is in your drink. In the [place] one drink equals one 12 oz bottle of beer (355 mL), one 5 oz glass of wine (148 mL), or one 1 oz glass of hard liquor (44 mL). Lifestyle  Take daily care of your teeth and gums.  Stay active. Exercise for at least 30 minutes on 5 or more days each week.  Do not use  any products that contain nicotine or tobacco, such as cigarettes, e-cigarettes, and chewing tobacco. If you need help quitting, ask your health care provider.  If you are sexually active, practice safe sex. Use a condom or other form of birth control (contraception) in order to prevent pregnancy and STIs (sexually transmitted infections).  If told by your health care provider, take low-dose aspirin daily starting at age 64. What's next?  Visit your health care provider once a year for a well check visit.  Ask your health care provider how often you should have your eyes and teeth checked.  Stay up to date on all vaccines. This information is not intended to replace advice given to you by your health care provider. Make sure you discuss any questions you have with your health care provider. Document Released: 08/25/2015 Document Revised: 04/09/2018 Document Reviewed: 04/09/2018 Elsevier Patient Education  2020 [employer].

## 2019-02-18 ENCOUNTER — Encounter: Payer: Self-pay | Admitting: Family Medicine

## 2019-02-18 DIAGNOSIS — R7303 Prediabetes: Secondary | ICD-10-CM

## 2019-02-18 DIAGNOSIS — E781 Pure hyperglyceridemia: Secondary | ICD-10-CM

## 2019-02-18 HISTORY — DX: Morbid (severe) obesity due to excess calories: E66.01

## 2019-02-18 HISTORY — DX: Prediabetes: R73.03

## 2019-02-18 HISTORY — DX: Pure hyperglyceridemia: E78.1

## 2019-02-18 LAB — BASIC METABOLIC PANEL
BUN/Creatinine Ratio: 24 — ABNORMAL HIGH (ref 9–23)
BUN: 18 mg/dL (ref 6–24)
CO2: 20 mmol/L (ref 20–29)
Calcium: 9 mg/dL (ref 8.7–10.2)
Chloride: 105 mmol/L (ref 96–106)
Creatinine, Ser: 0.75 mg/dL (ref 0.57–1.00)
GFR calc Af Amer: 114 mL/min/{1.73_m2} (ref >59)
GFR calc non Af Amer: 99 mL/min/{1.73_m2} (ref >59)
Glucose: 107 mg/dL — ABNORMAL HIGH (ref 65–99)
Potassium: 4.5 mmol/L (ref 3.5–5.2)
Sodium: 141 mmol/L (ref 134–144)

## 2019-02-18 LAB — LIPID PANEL
Chol/HDL Ratio: 3.7 ratio (ref 0.0–4.4)
Cholesterol, Total: 159 mg/dL (ref 100–199)
HDL: 43 mg/dL (ref >39)
LDL Calculated: 47 mg/dL (ref 0–99)
Triglycerides: 344 mg/dL — ABNORMAL HIGH (ref 0–149)
VLDL Cholesterol Cal: 69 mg/dL — ABNORMAL HIGH (ref 5–40)

## 2019-02-18 LAB — HIV ANTIBODY (ROUTINE TESTING W REFLEX): HIV Screen 4th Generation wRfx: NONREACTIVE

## 2019-02-18 LAB — HEMOGLOBIN A1C
Est. average glucose Bld gHb Est-mCnc: 128 mg/dL
Hgb A1c MFr Bld: 6.1 % — ABNORMAL HIGH (ref 4.8–5.6)

## 2019-02-25 ENCOUNTER — Telehealth: Payer: Self-pay | Admitting: Family Medicine

## 2019-02-25 DIAGNOSIS — K5909 Other constipation: Secondary | ICD-10-CM

## 2019-02-25 NOTE — Telephone Encounter (Signed)
Pt called and states that she got samples of linzess and states that it is really working for her, she is wondering if she can get a RX of that please send to [facility], [address] AT [address] and pt would like a call after this is sent in

## 2019-02-26 ENCOUNTER — Other Ambulatory Visit: Payer: Self-pay | Admitting: Medical

## 2019-02-26 MED ORDER — LINACLOTIDE 72 MCG PO CAPS: 72.0000 ug | ORAL_CAPSULE | Freq: Every day | ORAL | 2 refills | Status: DC

## 2019-02-26 NOTE — Telephone Encounter (Signed)
Please call her about a few things  I did send the prescription, and I am glad she is seeing improvement  She needs to make sure she is drinking plenty of water and getting fiber in the diet regularly  We do however want to make sure we are not missing anything related to the diagnosis.  Generally speaking with someone is having chronic constipation we will to rule out other causes.  Verify, has she ever had a colonoscopy?  Is she having any blood in the stool?  Does she have a family history of colon cancer?  Sometimes we will refer to gastroenterology to rule out other causes of constipation before we assume that it is benign chronic constipation.  So please find out those questions.

## 2019-02-26 NOTE — Telephone Encounter (Signed)
Patient was informed of message from provider. Pt stated she has never has a Colonoscopy, does not have blood in her stool and does not have a family history of colon cancer that she is aware of.

## 2019-02-26 NOTE — Telephone Encounter (Signed)
Glad she is not having any red flag symptoms, but given chronic constipation, I do recommend GI consult to rule out underlying causes.  She can continue Linzess for now as well.   Please refer to GI

## 2019-03-01 NOTE — Telephone Encounter (Signed)
Left message for pt to call me back 

## 2019-03-02 NOTE — Telephone Encounter (Signed)
Left message for pt to call me back 

## 2019-03-02 NOTE — Telephone Encounter (Signed)
Pt was notified and I have put her referral in epic.

## 2019-03-02 NOTE — Addendum Note (Signed)
Addended by: Minette Headland A on: 03/02/2019 02:37 PM   Modules accepted: Orders

## 2019-04-06 ENCOUNTER — Ambulatory Visit: Payer: BLUE CROSS/BLUE SHIELD

## 2019-05-03 ENCOUNTER — Encounter: Payer: Self-pay | Admitting: Family Medicine

## 2019-05-21 ENCOUNTER — Other Ambulatory Visit: Payer: Self-pay

## 2019-05-21 ENCOUNTER — Ambulatory Visit
Admission: RE | Admit: 2019-05-21 | Discharge: 2019-05-21 | Disposition: A | Discharge status: Home / Self Care | Payer: BC Managed Care – PPO | Source: Ambulatory Visit | Attending: Family Medicine | Admitting: Family Medicine

## 2019-05-21 DIAGNOSIS — Z1239 Encounter for other screening for malignant neoplasm of breast: Secondary | ICD-10-CM

## 2019-05-21 DIAGNOSIS — Z1231 Encounter for screening mammogram for malignant neoplasm of breast: Secondary | ICD-10-CM | POA: Diagnosis not present

## 2019-05-25 ENCOUNTER — Other Ambulatory Visit: Payer: Self-pay | Admitting: Family Medicine

## 2019-05-25 DIAGNOSIS — R928 Other abnormal and inconclusive findings on diagnostic imaging of breast: Secondary | ICD-10-CM

## 2019-05-28 ENCOUNTER — Ambulatory Visit
Admission: RE | Admit: 2019-05-28 | Discharge: 2019-05-28 | Disposition: A | Discharge status: Home / Self Care | Payer: BC Managed Care – PPO | Source: Ambulatory Visit | Attending: Family Medicine | Admitting: Family Medicine

## 2019-05-28 ENCOUNTER — Other Ambulatory Visit: Payer: Self-pay | Admitting: Family Medicine

## 2019-05-28 ENCOUNTER — Other Ambulatory Visit: Payer: Self-pay

## 2019-05-28 DIAGNOSIS — N6011 Diffuse cystic mastopathy of right breast: Secondary | ICD-10-CM | POA: Diagnosis not present

## 2019-05-28 DIAGNOSIS — N6311 Unspecified lump in the right breast, upper outer quadrant: Secondary | ICD-10-CM | POA: Diagnosis not present

## 2019-05-28 DIAGNOSIS — R928 Other abnormal and inconclusive findings on diagnostic imaging of breast: Secondary | ICD-10-CM | POA: Diagnosis not present

## 2019-05-28 DIAGNOSIS — N6310 Unspecified lump in the right breast, unspecified quadrant: Secondary | ICD-10-CM

## 2019-06-17 ENCOUNTER — Encounter (INDEPENDENT_AMBULATORY_CARE_PROVIDER_SITE_OTHER): Payer: Self-pay | Admitting: Bariatrics

## 2019-06-17 ENCOUNTER — Other Ambulatory Visit: Payer: Self-pay

## 2019-06-17 ENCOUNTER — Encounter: Payer: Self-pay | Admitting: Bariatrics

## 2019-06-17 ENCOUNTER — Ambulatory Visit (INDEPENDENT_AMBULATORY_CARE_PROVIDER_SITE_OTHER): Payer: BC Managed Care – PPO | Admitting: Bariatrics

## 2019-06-17 VITALS — BP 149/63 | HR 61 | Temp 98.1°F | Ht 65.0 in | Wt 264.0 lb

## 2019-06-17 DIAGNOSIS — E781 Pure hyperglyceridemia: Secondary | ICD-10-CM | POA: Diagnosis not present

## 2019-06-17 DIAGNOSIS — R0602 Shortness of breath: Secondary | ICD-10-CM

## 2019-06-17 DIAGNOSIS — K5909 Other constipation: Secondary | ICD-10-CM

## 2019-06-17 DIAGNOSIS — F3289 Other specified depressive episodes: Secondary | ICD-10-CM

## 2019-06-17 DIAGNOSIS — Z0289 Encounter for other administrative examinations: Secondary | ICD-10-CM

## 2019-06-17 DIAGNOSIS — R7303 Prediabetes: Secondary | ICD-10-CM

## 2019-06-17 DIAGNOSIS — R5383 Other fatigue: Secondary | ICD-10-CM

## 2019-06-17 DIAGNOSIS — R7309 Other abnormal glucose: Secondary | ICD-10-CM | POA: Diagnosis not present

## 2019-06-17 DIAGNOSIS — Z6841 Body Mass Index (BMI) 40.0 and over, adult: Secondary | ICD-10-CM

## 2019-06-17 DIAGNOSIS — Z9189 Other specified personal risk factors, not elsewhere classified: Secondary | ICD-10-CM

## 2019-06-17 NOTE — Progress Notes (Signed)
Office: [phone]  /  Fax: [phone]   Dear Julie Dingwall, NP-C,   Thank you for referring Julie Guerrero to our clinic. The following note includes my evaluation and treatment recommendations.  HPI:   Chief Complaint: OBESITY    Julie Guerrero has been referred by Julie Dingwall, NP-C for consultation regarding her obesity and obesity related comorbidities.    Julie Guerrero (MR# [id number]) is a 43 y.o. female who presents on 06/17/2019 for obesity evaluation and treatment. Current BMI is Body mass index is 43.93 kg/m.Marland Kitchen [name] has been struggling with her weight for many years and has been unsuccessful in either losing weight, maintaining weight loss, or reaching her healthy weight goal.     Julie Guerrero attended our information session and states she is currently in the action stage of change and ready to dedicate time achieving and maintaining a healthier weight. [name] is interested in becoming our patient and working on intensive lifestyle modifications including (but not limited to) diet, exercise and weight loss.      Julie Guerrero states that she does like to [employer]. She craves potatoes, rice, bread, and salty snacks. She does wake up sometimes in the middle of the night hungry and sometimes needs something to eat to get back to sleep.    Julie Guerrero states her family eats meals together she thinks her family will eat healthier with her her desired weight loss is 89 lbs she has been heavy most of her life she started gaining weight at age 30 her heaviest weight ever was 300 lbs. she has significant food cravings issues  she snacks frequently in the evenings she wakes up sometimes in the middle of the night to eat she skips breakfast and lunch daily she frequently makes poor food choices she has problems with excessive hunger  she somewhat eats larger portions than normal  she has binge eating behaviors she struggles with emotional eating    Fatigue [name] feels her  energy is lower than it should be. This has worsened with weight gain and has worsened recently. Julie Guerrero denies daytime somnolence and  admits to waking up still tired. Patient is at risk for obstructive sleep apnea. Patent has a history of symptoms of morning headache. Patient generally gets 5-7 hours of sleep per night, and states they generally have restful sleep. Snoring is not present. Apneic episodes are not present. Epworth Sleepiness Score is 4. Julie Guerrero notes fatigue and shortness of breath with certain activities and reports cold intolerance.  Dyspnea on exertion Julie Guerrero notes increasing shortness of breath with exercising and seems to be worsening over time with weight gain. She notes getting out of breath sooner with activity than she used to. This has gotten worse recently. [name] denies orthopnea.  Pre-Diabetes Julie Guerrero has a new diagnosis of prediabetes (July 2020) based on her elevated Hgb A1c and was informed this puts her at greater risk of developing diabetes. Last A1c 6.1 on 02/17/2019.  She is on no medications currently and continues to work on diet and exercise to decrease risk of diabetes. She denies nausea or hypoglycemia. She does note polyphagia.  At risk for diabetes Julie Guerrero is at higher than average risk for developing diabetes due to her obesity. She currently denies polyuria or polydipsia.  Hypertriglyceridemia Julie Guerrero has hypertriglyceridemia with triglycerides 344 on 02/17/2019 with VLDL 69, otherwise normal.  Chronic Constipation  Julie Guerrero notes long-term chronic constipation and is taking Linzess.  Depression with emotional eating behaviors Julie Guerrero is struggling with emotional eating and using  food for comfort to the extent that it is negatively impacting her health. She often snacks when she is not hungry. Julie Guerrero sometimes feels she is out of control and then feels guilty that she made poor food choices. She has been working on behavior modification  techniques to help reduce her emotional eating and has been somewhat successful. [name] works third shift and notes nighttime eating behaviors. Her PHQ-9 score was 16 today. She shows no sign of suicidal or homicidal ideations.  Depression Screen [name]'s Food and Mood (modified PHQ-9) score was 16. Depression screen PHQ 2/9 06/17/2019  Decreased Interest 3  Down, Depressed, Hopeless 3  PHQ - 2 Score 6  Altered sleeping 1  Tired, decreased energy 3  Change in appetite 3  Feeling bad or failure about yourself  3  Trouble concentrating 0  Moving slowly or fidgety/restless 0  Suicidal thoughts 0  PHQ-9 Score 16  Difficult doing work/chores Not difficult at all    ASSESSMENT AND PLAN:  Other fatigue - Plan: EKG 12-Lead, Comprehensive metabolic panel, Vitamin D (25 hydroxy), TSH, T4, free, T3  SOB (shortness of breath) on exertion  Prediabetes - Plan: Hemoglobin A1c, Insulin, random  Hypertriglyceridemia - Plan: Lipid Panel With LDL/HDL Ratio  Chronic constipation  Other depression - with emotional eating   At risk for diabetes mellitus  Class 3 severe obesity with serious comorbidity and body mass index (BMI) of 40.0 to 44.9 in adult, unspecified obesity type (HCC)  PLAN:  Fatigue [name] was informed that her fatigue may be related to obesity, depression or many other causes. Labs will be ordered, and in the meanwhile [name] has agreed to work on diet, exercise and weight loss to help with fatigue. Proper sleep hygiene was discussed including the need for 7-8 hours of quality sleep each night. A sleep study was not ordered based on symptoms and Epworth score. [name] will gradually increase exercise over time and will not do any pounding exercises.  Dyspnea on exertion [name]'s shortness of breath appears to be obesity related and exercise induced. She has agreed to work on weight loss and gradually increase exercise to treat her exercise induced shortness of breath.  If [name] follows our instructions and loses weight without improvement of her shortness of breath, we will plan to refer to pulmonology. We will monitor this condition regularly. Julie Guerrero agrees to this plan.  Pre-Diabetes Anicia will continue to work on weight loss, exercise, and decreasing simple carbohydrates in her diet to help decrease the risk of diabetes. We dicussed metformin including benefits and risks. She was informed that eating too many simple carbohydrates or too many calories at one sitting increases the likelihood of GI side effects. Katelynn will have insulin and A1c levels checked follow-up in 2-3 weeks for review of lab results.  Diabetes risk counseling Julie Guerrero was given extended (15 minutes) diabetes prevention counseling today. She is 43 y.o. female and has risk factors for diabetes including obesity. We discussed intensive lifestyle modifications today with an emphasis on weight loss as well as increasing exercise and decreasing simple carbohydrates in her diet.  Hypertriglyceridemia Julie Guerrero will have lipids checked. She will decrease saturated fats, increase PUFA's and MUFA's.  Chronic Constipation  Julie Guerrero was informed decrease bowel movement frequency is normal while losing weight, but stools should not be hard or painful. She was advised to increase her H20 intake, eat more raw vegetables, and work on increasing her fiber intake. High fiber foods were discussed today.  Depression with Emotional Eating  Behaviors We discussed behavior modification techniques today to help [name] deal with her emotional eating and depression. Julie Guerrero will be referred to Dr. Dewaine Conger, our bariatric psychologist, for evaluation.  Depression Screen Julie Guerrero had a strongly positive depression screening. Depression is commonly associated with obesity and often results in emotional eating behaviors. We will monitor this closely and work on CBT to help improve the non-hunger eating  patterns.   Obesity Julie Guerrero is currently in the action stage of change and her goal is to continue with weight loss efforts. I recommend Julie Guerrero begin the structured treatment plan as follows:  She has agreed to follow the Category 3 plan. Julie Guerrero will work on meal planning, decreasing carbohydrates, and stop all sugary drinks.  Julie Guerrero has been instructed to eventually work up to a goal of 150 minutes of combined cardio and strengthening exercise per week for weight loss and overall health benefits. We discussed the following Behavioral Modification Strategies today: increasing lean protein intake, decreasing simple carbohydrates, increasing vegetables, increase H20 intake, decrease liquid calories, work on meal planning and easy cooking plans, ways to avoid night time snacking, better snacking choices, emotional eating strategies, and avoiding temptations.   She was informed of the importance of frequent follow-up visits to maximize her success with intensive lifestyle modifications for her multiple health conditions. She was informed we would discuss her lab results at her next visit unless there is a critical issue that needs to be addressed sooner. Zia agreed to keep her next visit at the agreed upon time to discuss these results.  ALLERGIES: No Known Allergies  MEDICATIONS: Current Outpatient Medications on File Prior to Visit  Medication Sig Dispense Refill  . ibuprofen (ADVIL) 100 MG/5ML suspension Take 200 mg by mouth every 4 (four) hours as needed.     No current facility-administered medications on file prior to visit.     PAST MEDICAL HISTORY: Past Medical History:  Diagnosis Date  . Chronic low back pain   . Constipation   . Edema, lower extremity   . Hernia, inguinal, right   . Hypertriglyceridemia 02/18/2019  . Morbid obesity (HCC) 02/18/2019  . Obesity   . Prediabetes 02/18/2019  . SOB (shortness of breath)     PAST SURGICAL HISTORY: Past Surgical History:   Procedure Laterality Date  . TUBAL LIGATION      SOCIAL HISTORY: Social History   Tobacco Use  . Smoking status: Light Tobacco Smoker    Packs/day: 0.10    Years: 6.00    Pack years: 0.60    Types: Cigarettes  . Smokeless tobacco: Never Used  Substance Use Topics  . Alcohol use: Yes    Comment: occ  . Drug use: No    FAMILY HISTORY: Family History  Problem Relation Age of Onset  . Hypertension Mother   . Asthma Mother   . Diabetes Mother   . High Cholesterol Mother   . High blood pressure Mother   . Obesity Mother   . Thyroid disease Maternal Grandmother   . Diabetes Maternal Grandmother   . Other Father        unknown  . Diabetes Father   . Obesity Father   . Heart disease Neg Hx   . Stroke Neg Hx    ROS: Review of Systems  Constitutional: Positive for malaise/fatigue.  Eyes:       Positive for wearing glasses or contacts.  Respiratory: Positive for shortness of breath (with activity).        Positive for difficulty  breathing while lying down.  Cardiovascular: Negative for orthopnea.  Gastrointestinal: Positive for constipation. Negative for nausea.  Musculoskeletal: Positive for back pain.       Positive for muscle stiffness.  Neurological:       Positive for very cold feet or hands.  Endo/Heme/Allergies: Bruises/bleeds easily.       Negative for hypoglycemia. Positive for polyphagia. Positive for cold intolerance.  Psychiatric/Behavioral: Positive for depression (emotional eating). Negative for suicidal ideas.       Negative for homicidal ideas.   PHYSICAL EXAM: Blood pressure (!) 149/63, pulse 61, temperature 98.1 F (36.7 C), height  (1.651 m), weight 264 lb (119.7 kg), last menstrual period 06/03/2019, SpO2 99 %. Body mass index is 43.93 kg/m. Physical Exam Vitals signs reviewed.  Constitutional:      Appearance: Normal appearance. She is well-developed. She is obese.  HENT:     Head: Normocephalic and atraumatic.     Nose: Nose normal.   Eyes:     General: No scleral icterus. Neck:     Musculoskeletal: Normal range of motion.  Cardiovascular:     Rate and Rhythm: Normal rate and regular rhythm.  Pulmonary:     Effort: Pulmonary effort is normal. No respiratory distress.  Abdominal:     Palpations: Abdomen is soft.     Tenderness: There is no abdominal tenderness.  Musculoskeletal: Normal range of motion.     Comments: Range of motion normal in all four extremities.  Skin:    General: Skin is warm and dry.  Neurological:     Mental Status: She is alert and oriented to person, place, and time.     Coordination: Coordination normal.  Psychiatric:        Mood and Affect: Mood and affect normal.        Behavior: Behavior normal.   RECENT LABS AND TESTS: BMET    Component Value Date/Time   NA 141 02/17/2019 1100   K 4.5 02/17/2019 1100   CL 105 02/17/2019 1100   CO2 20 02/17/2019 1100   GLUCOSE 107 (H) 02/17/2019 1100   BUN 18 02/17/2019 1100   CREATININE 0.75 02/17/2019 1100   CALCIUM 9.0 02/17/2019 1100   GFRNONAA 99 02/17/2019 1100   GFRAA 114 02/17/2019 1100   Lab Results  Component Value Date   HGBA1C 6.1 (H) 02/17/2019   No results found for: INSULIN CBC    Component Value Date/Time   WBC 9.0 01/15/2019 1427   WBC 12.6 (H) 05/16/2016 1048   RBC 4.54 01/15/2019 1427   RBC 4.42 05/16/2016 1048   HGB 11.3 01/15/2019 1427   HCT 35.4 01/15/2019 1427   PLT 385 01/15/2019 1427   MCV 78 (L) 01/15/2019 1427   MCH 24.9 (L) 01/15/2019 1427   MCH 25.6 (L) 05/16/2016 1048   MCHC 31.9 01/15/2019 1427   MCHC 32.3 05/16/2016 1048   RDW 13.6 01/15/2019 1427   LYMPHSABS 2,772 05/16/2016 1048   MONOABS 1,260 (H) 05/16/2016 1048   EOSABS 378 05/16/2016 1048   BASOSABS 0 05/16/2016 1048   Iron/TIBC/Ferritin/ %Sat No results found for: IRON, TIBC, FERRITIN, IRONPCTSAT Lipid Panel     Component Value Date/Time   CHOL 159 02/17/2019 1100   TRIG 344 (H) 02/17/2019 1100   HDL 43 02/17/2019 1100    CHOLHDL 3.7 02/17/2019 1100   LDLCALC 47 02/17/2019 1100   Hepatic Function Panel  No results found for: PROT, ALBUMIN, AST, ALT, ALKPHOS, BILITOT, BILIDIR, IBILI    Component Value  Date/Time   TSH 2.390 01/15/2019 1425   No results found for Vitamin D, 25-Hydroxy  ECG  shows Sinus  Rhythm with a rate of 66 BPM. Consider old anterior infarct. Nonspecific T-abnormality. Abnormal.  INDIRECT CALORIMETER done today shows a VO2 of 273 and a REE of 1902.  Her calculated basal metabolic rate is [id number] thus her basal metabolic rate is better than expected.  OBESITY BEHAVIORAL INTERVENTION VISIT  Today's visit was #1  Starting weight: 264 lbs Starting date: 06/17/2019 Today's weight: 264 lbs Today's date: 06/17/2019 Total lbs lost to date: 0    06/17/2019  Height 5\' 5"  (1.651 m)  Weight 264 lb (119.7 kg)  BMI (Calculated) 43.93  BLOOD PRESSURE - SYSTOLIC 149  BLOOD PRESSURE - DIASTOLIC 63  Waist Measurement  50 inches   Body Fat % 52.6 %  Total Body Water (lbs) 97.2 lbs  RMR 1902   ASK: We discussed the diagnosis of obesity with Glenetta BorgVeronica M Cherney today and [place] agreed to give us permission to discuss obesity behavioral modification therapy today.  ASSESS: Julie BattiestVeronica has the diagnosis of obesity and her BMI today is 44.0. Julie BattiestVeronica is in the action stage of change.  ADVISE: Julie BattiestVeronica was educated on the multiple health risks of obesity as well as the benefit of weight loss to improve her health. She was advised of the need for long term treatment and the importance of lifestyle modifications to improve her current health and to decrease her risk of future health problems.  AGREE: Multiple dietary modification options and treatment options were discussed and  Julie BattiestVeronica agreed to follow the recommendations documented in the above note.  ARRANGE: Julie BattiestVeronica was educated on the importance of frequent visits to treat obesity as outlined per CMS and USPSTF guidelines and agreed to schedule  her next follow up appointment today.  Fernanda DrumI, Denise Haag, am acting as Energy managertranscriptionist for [employer] Karsynn Deweese, DO   I have reviewed the above documentation for accuracy and completeness, and I agree with the above. -Corinna CapraAngel Deklan Minar, DO

## 2019-06-18 LAB — COMPREHENSIVE METABOLIC PANEL
ALT: 22 IU/L (ref 0–32)
AST: 27 IU/L (ref 0–40)
Albumin/Globulin Ratio: 1.5 (ref 1.2–2.2)
Albumin: 4 g/dL (ref 3.8–4.8)
Alkaline Phosphatase: 49 IU/L (ref 39–117)
BUN/Creatinine Ratio: 21 (ref 9–23)
BUN: 15 mg/dL (ref 6–24)
Bilirubin Total: <0.2 mg/dL (ref 0.0–1.2)
CO2: 22 mmol/L (ref 20–29)
Calcium: 8.9 mg/dL (ref 8.7–10.2)
Chloride: 105 mmol/L (ref 96–106)
Creatinine, Ser: 0.72 mg/dL (ref 0.57–1.00)
GFR calc Af Amer: 119 mL/min/{1.73_m2} (ref >59)
GFR calc non Af Amer: 103 mL/min/{1.73_m2} (ref >59)
Globulin, Total: 2.7 g/dL (ref 1.5–4.5)
Glucose: 99 mg/dL (ref 65–99)
Potassium: 4 mmol/L (ref 3.5–5.2)
Sodium: 141 mmol/L (ref 134–144)
Total Protein: 6.7 g/dL (ref 6.0–8.5)

## 2019-06-18 LAB — T3: T3, Total: 152 ng/dL (ref 71–180)

## 2019-06-18 LAB — HEMOGLOBIN A1C
Est. average glucose Bld gHb Est-mCnc: 123 mg/dL
Hgb A1c MFr Bld: 5.9 % — ABNORMAL HIGH (ref 4.8–5.6)

## 2019-06-18 LAB — LIPID PANEL WITH LDL/HDL RATIO
Cholesterol, Total: 150 mg/dL (ref 100–199)
HDL: 48 mg/dL (ref >39)
LDL Chol Calc (NIH): 72 mg/dL (ref 0–99)
LDL/HDL Ratio: 1.5 ratio (ref 0.0–3.2)
Triglycerides: 177 mg/dL — ABNORMAL HIGH (ref 0–149)
VLDL Cholesterol Cal: 30 mg/dL (ref 5–40)

## 2019-06-18 LAB — T4, FREE: Free T4: 1.2 ng/dL (ref 0.82–1.77)

## 2019-06-18 LAB — TSH: TSH: 5.36 u[IU]/mL — ABNORMAL HIGH (ref 0.450–4.500)

## 2019-06-18 LAB — VITAMIN D 25 HYDROXY (VIT D DEFICIENCY, FRACTURES): Vit D, 25-Hydroxy: 9.4 ng/mL — ABNORMAL LOW (ref 30.0–100.0)

## 2019-06-18 LAB — INSULIN, RANDOM: INSULIN: 10 u[IU]/mL (ref 2.6–24.9)

## 2019-06-21 ENCOUNTER — Encounter (INDEPENDENT_AMBULATORY_CARE_PROVIDER_SITE_OTHER): Payer: Self-pay | Admitting: Bariatrics

## 2019-06-21 DIAGNOSIS — R7989 Other specified abnormal findings of blood chemistry: Secondary | ICD-10-CM | POA: Insufficient documentation

## 2019-06-21 DIAGNOSIS — E559 Vitamin D deficiency, unspecified: Secondary | ICD-10-CM | POA: Insufficient documentation

## 2019-06-23 NOTE — Progress Notes (Unsigned)
Office: [phone]  /  Fax: [phone]    Date: July 06, 2019  Time Seen: *** Duration: *** minutes Provider: Glennie Isle, PsyD Type of Session: Intake for Individual Therapy  Type of Contact: Face-to-face  Informed Consent for In-Person Services During COVID-19: During today's appointment, information about the decision to initiate in-person services in light of the [facility]-62 public health crisis was discussed. Julie Guerrero and this provider agreed to meet in person for some or all future appointments. If there is a resurgence of the pandemic or other health concerns arise, telepsychological services may be initiated and any related concerns will be discussed and an attempt to address them will be made. Julie Guerrero verbally acknowledged understanding that if necessary, this provider may determine there is a need to initiate telepsychological services for everyone's well-being. Julie Guerrero expressed understanding she may request to initiate telepsychological services, and that request will be respected as long as it is feasible and clinically appropriate. Regarding telepsychological services, Julie Guerrero acknowledged she is ultimately responsible for understanding her insurance benefits as it relates to reimbursement of telepsychological services. Moreover, the risks for opting for in-person services was discussed. Julie Guerrero verbally acknowledged understanding that by coming to the office, she is assuming the risk of exposure to the coronavirus or other public risk, and the risk may increase if Julie Guerrero travels by public transportation, cab, or [employer]. To obtain in-person services, Julie Guerrero verbally agreed to taking certain precautions (e.g., screening prior to appointment; universal masking; social distancing of 6 feet; proper hand hygiene; no visitors) set forth by [facility] to keep everyone safe from exposure, sickness, and possible death. This information was *** shared by front desk staff  either at the time of scheduling and/or during the check-in process. Julie Guerrero expressed understanding that should she not adhere to these safeguards, it may result in starting/returning to a telepsychological service arrangement and/or the exploration of other options for treatment. Julie Guerrero acknowledged understanding that [facility] will follow the protocol set forth by [facility] should a patient present with a fever or other symptoms or disclose recent exposure, which will include rescheduling the appointment. Furthermore, Julie Guerrero acknowledged understanding that precautions may change if additional local, state or federal orders or guidelines are published. This provider also shared that if Julie Guerrero tests positive for the coronavirus, this provider may be required to notify local health authorities that Julie Guerrero was in the [facility] clinic. Only minimum information necessary for data collection will be disclosed. This provider will follow [place]'s disclosure policy should this provider or staff test positive for the coronavirus. To avoid handling of paper/writing instruments and increasing likelihood of touching, verbal consent was obtained by Julie Guerrero during today's appointment prior to proceeding. Julie Guerrero provided verbal consent to proceed, and acknowledged understanding that by verbally consenting to proceed, she is agreeable to all information noted above.   Informed Consent: The provider's role was explained to Julie Guerrero. The provider reviewed and discussed issues of confidentiality, privacy, and limits therein (e.g., reporting obligations). In addition to verbal informed consent, written informed consent for psychological services was obtained from Julie Guerrero prior to the initial intake interview. Written consent included information concerning the practice, financial arrangements, and confidentiality and patients' rights. Since the clinic is not a 24/7 crisis  center, mental health emergency resources were shared, and the provider explained MyChart, e-mail, voicemail, and/or other messaging systems should be utilized only for non-emergency reasons. This provider also explained that information obtained during appointments will be placed in Julie Guerrero's  medical record in a confidential manner and relevant information will be shared with other providers at [facility] that she meets with for coordination of care. Julie Guerrero verbally acknowledged understanding of the aforementioned, and agreed to use mental health emergency resources discussed if needed. Moreover, Julie Guerrero agreed information may be shared with other [facility] providers as needed for coordination of care. By signing the service agreement document, Julie Guerrero provided written consent for coordination of care.   Chief Complaint/HPI: Julie Guerrero was referred by Julie Guerrero due to depression with emotional eating behaviors. Per the note for the initial visit with Julie Guerrero on June 17, 2019, "Julie Guerrero is struggling with emotional eating and using food for comfort to the extent that it is negatively impacting her health. She often snacks when she is not hungry. Julie Guerrero sometimes feels she is out of control and then feels guilty that she made poor food choices. She has been working on behavior modification techniques to help reduce her emotional eating and has been somewhat successful. Julie Guerrero works third shift and notes nighttime eating behaviors. Her PHQ-9 score was 16 today. She shows no sign of suicidal or homicidal ideations." During the initial appointment with Julie Guerrero at [facility] on June 17, 2019, Julie Guerrero reported experiencing the following: significant food cravings issues , snacking frequently in the evenings, frequently making poor food choices, binge eating behaviors, struggling with emotional eating, having problems with excessive hunger,  sometimes waking up in the middle of the night to eat, skipping breakfast and lunch daily and somewhat eating larger portions than normal. Julie Guerrero's Food and Mood (modified PHQ-9) score was 16.   During today's appointment, Julie Guerrero was verbally administered a questionnaire assessing various behaviors related to emotional eating. Jema endorsed the following: {gbmoodandfood:21755}. She shared she craves ***. Julie Guerrero believes the onset of emotional eating was *** and described the current frequency of emotional eating as ***. In addition, Julie Guerrero {gblegal:22371} a history of binge eating. *** Moreover, Julie Guerrero indicated *** triggers emotional eating, whereas *** makes emotional eating better. Furthermore, Julie Guerrero {gblegal:22371} other problems of concern. ***   Mental Status Examination:  Appearance: {Appearance:22431} Behavior: {Behavior:22445} Mood: {gbmood:21757} Affect: {Affect:22436} Speech: {Speech:22432} Eye Contact: {Eye Contact:22433} Psychomotor Activity: {Motor Activity:22434} Thought Process: {thought process:22448}  Content/Perceptual Disturbances: {disturbances:22451} Orientation: {Orientation:22437} Cognition/Sensorium: {gbcognition:22449} Insight: {Insight:22446} Judgment: {Insight:22446}  Family & Psychosocial History: Tegan reported she is ***. She indicated she is currently ***. Additionally, Julie Guerrero shared her highest level of education obtained is ***. Currently, Kielee's social support system consists of ***. Moreover, Makenzey stated she resides with ***.   Medical History: ***  Mental Health History: Shayne {gblegal:22371} a history of therapeutic services. Katilynn denied a history of hospitalizations for psychiatric concerns, and has never met with a psychiatrist.*** Ema stated she was *** psychotropic medications. Rosary {gblegal:22371} a family history of mental health related concerns. *** Minnie denied a trauma history, including  {gbtrauma:22071} abuse, as well as neglect. ***  Jodie described her typical mood as ***. Aside from concerns noted above and endorsed on the PHQ-9 and GAD-7, Julie Guerrero reported ***. Julie Guerrero {gblegal:22371} current alcohol use. *** She {gblegal:22371} tobacco use. *** She {MEQASTM:19622} illicit/recreational substance use. Regarding caffeine intake, Rayaan reported ***. Furthermore, Julie Guerrero denied experiencing the following: {gbsxs:21965}. She also denied history of and current suicidal ideation, plan, and intent; history of and current homicidal ideation, plan, and intent; and history of and current engagement in self-harm.  The following strengths were reported by Julie Guerrero: ***. The  following strengths were observed by this provider: {gbstrengths:22223}.  Legal History: Addelynn {gblegal:22371} a history of legal involvement.   Structured Assessment Results: The Patient Health Questionnaire-9 (PHQ-9) is a self-report measure that assesses symptoms and severity of depression over the course of the last two weeks. Roanne obtained a score of *** suggesting {GBPHQ9SEVERITY:21752}. Dawnell finds the endorsed symptoms to be {gbphq9difficulty:21754}. Little interest or pleasure in doing things ***  Feeling down, depressed, or hopeless ***  Trouble falling or staying asleep, or sleeping too much ***  Feeling tired or having little energy ***  Poor appetite or overeating ***  Feeling bad about yourself --- or that you are a failure or have let yourself or your family down ***  Trouble concentrating on things, such as reading the newspaper or watching television ***  Moving or speaking so slowly that other people could have noticed? Or the opposite --- being so fidgety or restless that you have been moving around a lot more than usual ***  Thoughts that you would be better off dead or hurting yourself in some way ***  PHQ-9 Score ***    The Generalized Anxiety Disorder-7 (GAD-7) is a brief  self-report measure that assesses symptoms of anxiety over the course of the last two weeks. Kynzleigh obtained a score of *** suggesting {gbgad7severity:21753}. Corley finds the endorsed symptoms to be {gbphq9difficulty:21754}. Feeling nervous, anxious, on edge ***  Not being able to stop or control worrying ***  Worrying too much about different things ***  Trouble relaxing ***  Being so restless that it's hard to sit still ***  Becoming easily annoyed or irritable ***  Feeling afraid as if something awful might happen ***  GAD-7 Score ***   Interventions: A chart review was conducted prior to the clinical intake interview. The PHQ-9, and GAD-7 were verbally administered and a clinical intake interview was completed. In addition, Sanaia was verbally administered a Mood and Food questionnaire to assess various behaviors related to emotional eating. Throughout session, empathic reflections and validation was provided. Continuing treatment with this provider was discussed and a treatment goal was established. Psychoeducation regarding emotional versus physical hunger was provided. Britanny was given a handout to utilize between now and the next appointment to increase awareness of hunger patterns and subsequent eating. ***  Provisional DSM-5 Diagnosis: {Diagnoses:22752}  Plan: Chanteria appears able and willing to participate as evidenced by collaboration on a treatment goal, engagement in reciprocal conversation, and asking questions as needed for clarification. The next appointment will be scheduled in {gbweeks:21758}. The following treatment goal was established: {gbtxgoals:21759}. For the aforementioned goal, Verbie can benefit from biweekly individual therapy sessions that are brief in duration for approximately four to six sessions. The treatment modality will be individual therapeutic services, including an eclectic therapeutic approach utilizing techniques from Cognitive Behavioral Therapy,  Patient Centered Therapy, Dialectical Behavior Therapy, Acceptance and Commitment Therapy, Interpersonal Therapy, and Cognitive Restructuring. Therapeutic approach will include various interventions as appropriate, such as validation, support, mindfulness, thought defusion, reframing, psychoeducation, values assessment, and role playing. This provider will regularly review the treatment plan and medical chart to keep informed of status changes. Armetta expressed understanding and agreement with the initial treatment plan of care.

## 2019-07-01 ENCOUNTER — Ambulatory Visit (INDEPENDENT_AMBULATORY_CARE_PROVIDER_SITE_OTHER): Payer: BC Managed Care – PPO | Admitting: Bariatrics

## 2019-07-01 ENCOUNTER — Other Ambulatory Visit: Payer: Self-pay

## 2019-07-01 VITALS — BP 126/86 | HR 64 | Temp 98.4°F | Ht 65.0 in | Wt 258.0 lb

## 2019-07-01 DIAGNOSIS — R7989 Other specified abnormal findings of blood chemistry: Secondary | ICD-10-CM | POA: Diagnosis not present

## 2019-07-01 DIAGNOSIS — Z6841 Body Mass Index (BMI) 40.0 and over, adult: Secondary | ICD-10-CM

## 2019-07-01 DIAGNOSIS — R7303 Prediabetes: Secondary | ICD-10-CM | POA: Diagnosis not present

## 2019-07-01 DIAGNOSIS — E559 Vitamin D deficiency, unspecified: Secondary | ICD-10-CM | POA: Diagnosis not present

## 2019-07-01 DIAGNOSIS — Z9189 Other specified personal risk factors, not elsewhere classified: Secondary | ICD-10-CM

## 2019-07-01 MED ORDER — VITAMIN D (ERGOCALCIFEROL) 1.25 MG (50000 UNIT) PO CAPS: 50000.0000 [IU] | ORAL_CAPSULE | ORAL | 0 refills | Status: DC

## 2019-07-01 MED ORDER — METFORMIN HCL 500 MG PO TABS: 500.0000 mg | ORAL_TABLET | Freq: Two times a day (BID) | ORAL | 0 refills | Status: DC

## 2019-07-05 ENCOUNTER — Encounter (INDEPENDENT_AMBULATORY_CARE_PROVIDER_SITE_OTHER): Payer: Self-pay | Admitting: Bariatrics

## 2019-07-05 NOTE — Progress Notes (Signed)
Office: [phone]  /  Fax: [phone]   HPI:   Chief Complaint: OBESITY Julie Guerrero is here to discuss her progress with her obesity treatment plan. She is on the Category 3 plan and is following her eating plan approximately 90% of the time. She states she is exercising on the treadmill 12 minutes 4 times per week. Julie Guerrero is down 6 lbs and doing well. She liked having more options with the meal plan. She still felt like she ate everything on the plan. Her weight is 258 lb (117 kg) today and has had a weight loss of 6 pounds over a period of 2 weeks since her last visit. She has lost 6 lbs since starting treatment with [place].  Vitamin D deficiency Julie Guerrero has a diagnosis of Vitamin D deficiency. Last Vitamin D was 9.4 on 06/17/2019. She is not currently taking Vit D and denies nausea, vomiting or muscle weakness.  Pre-Diabetes Julie Guerrero has a diagnosis of prediabetes based on her elevated Hgb A1c and was informed this puts her at greater risk of developing diabetes. Last A1c was 5.9 on 06/17/2019 with an insulin of 10.0. She is not taking metformin currently and continues to work on diet and exercise to decrease risk of diabetes. She denies nausea or hypoglycemia.  At risk for diabetes Julie Guerrero is at higher than average risk for developing diabetes due to her obesity. She currently denies polyuria or polydipsia.  Elevated TSH Julie Guerrero had an elevated TSH of 5.360 on 06/17/2019 and is not on any medications currently.  ASSESSMENT AND PLAN:  Vitamin D deficiency - Plan: Vitamin D, Ergocalciferol, (DRISDOL) 1.25 MG (50000 UT) CAPS capsule  Prediabetes - Plan: metFORMIN (GLUCOPHAGE) 500 MG tablet  Elevated TSH  At risk for diabetes mellitus  Class 3 severe obesity with serious comorbidity and body mass index (BMI) of 40.0 to 44.9 in adult, unspecified obesity type (Julie Guerrero)  PLAN:  Vitamin D Deficiency Julie Guerrero was informed that low Vitamin D levels contributes to fatigue and are  associated with obesity, breast, and colon cancer. She agrees to continue to take prescription Vit D @ 50,000 IU every week #4 with 0 refills and will follow-up for routine testing of Vitamin D, at least 2-3 times per year. She was informed of the risk of over-replacement of Vitamin D and agrees to not increase her dose unless she discusses this with [place] first. Julie Guerrero agrees to follow-up with our clinic in 2 weeks.  Pre-Diabetes Julie Guerrero will continue to work on weight loss, exercise, and decreasing simple carbohydrates in her diet to help decrease the risk of diabetes. We dicussed metformin including benefits and risks. She was informed that eating too many simple carbohydrates or too many calories at one sitting increases the likelihood of GI side effects. Ahaana will decrease carbohydrates, increase protein and healthy fats. She was given a prescription for metformin 500 mg 1 with first meal (1:00 PM) and 1 with dinner at 8:30 PM #60 with 0 refills. She agrees to follow-up with our clinic in 2 weeks.  Diabetes risk counseling Julie Guerrero was given extended (15 minutes) diabetes prevention counseling today. She is 43 y.o. female and has risk factors for diabetes including obesity. We discussed intensive lifestyle modifications today with an emphasis on weight loss as well as increasing exercise and decreasing simple carbohydrates in her diet.  Elevated TSH Julie Guerrero will have TSH level rechecked in the future.  Obesity Julie Guerrero is currently in the action stage of change. As such, her goal is to continue with  weight loss efforts. She has agreed to follow the Category 3 plan with additional lunch options. Asna will work on meal planning and increasing her water intake. Julie Guerrero has been instructed to work up to a goal of 150 minutes of combined cardio and strengthening exercise per week for weight loss and overall health benefits. We discussed the following Behavioral Modification Strategies  today: increasing lean protein intake, decreasing simple carbohydrates, increasing vegetables, increase H20 intake, decrease eating out, no skipping meals, work on meal planning and easy cooking plans, keeping healthy foods in the home, dealing with family/coworker sabotage, travel eating strategies, holiday eating strategies, celebration eating strategies, and avoiding temptations.  Julie Guerrero has agreed to follow-up with our clinic in 2 weeks. She was informed of the importance of frequent follow-up visits to maximize her success with intensive lifestyle modifications for her multiple health conditions.  ALLERGIES: No Known Allergies  MEDICATIONS: Current Outpatient Medications on File Prior to Visit  Medication Sig Dispense Refill  . ibuprofen (ADVIL) 100 MG/5ML suspension Take 200 mg by mouth every 4 (four) hours as needed.     No current facility-administered medications on file prior to visit.     PAST MEDICAL HISTORY: Past Medical History:  Diagnosis Date  . Chronic low back pain   . Constipation   . Edema, lower extremity   . Hernia, inguinal, right   . Hypertriglyceridemia 02/18/2019  . Morbid obesity (HCC) 02/18/2019  . Obesity   . Prediabetes 02/18/2019  . SOB (shortness of breath)     PAST SURGICAL HISTORY: Past Surgical History:  Procedure Laterality Date  . TUBAL LIGATION      SOCIAL HISTORY: Social History   Tobacco Use  . Smoking status: Light Tobacco Smoker    Packs/day: 0.10    Years: 6.00    Pack years: 0.60    Types: Cigarettes  . Smokeless tobacco: Never Used  Substance Use Topics  . Alcohol use: Yes    Comment: occ  . Drug use: No    FAMILY HISTORY: Family History  Problem Relation Age of Onset  . Hypertension Mother   . Asthma Mother   . Diabetes Mother   . High Cholesterol Mother   . High blood pressure Mother   . Obesity Mother   . Thyroid disease Maternal Grandmother   . Diabetes Maternal Grandmother   . Other Father        unknown  .  Diabetes Father   . Obesity Father   . Heart disease Neg Hx   . Stroke Neg Hx    ROS: Review of Systems  Gastrointestinal: Negative for nausea and vomiting.  Musculoskeletal:       Negative for muscle weakness.  Endo/Heme/Allergies:       Negative for hypoglycemia.   PHYSICAL EXAM: Blood pressure 126/86, pulse 64, temperature 98.4 F (36.9 C), height 5\' 5"  (1.651 m), weight 258 lb (117 kg), last menstrual period 06/03/2019, SpO2 98 %. Body mass index is 42.93 kg/m. Physical Exam Vitals signs reviewed.  Constitutional:      Appearance: Normal appearance. She is obese.  Cardiovascular:     Rate and Rhythm: Normal rate.     Pulses: Normal pulses.  Pulmonary:     Effort: Pulmonary effort is normal.     Breath sounds: Normal breath sounds.  Musculoskeletal: Normal range of motion.  Skin:    General: Skin is warm and dry.  Neurological:     Mental Status: She is alert and oriented to person, place, and  time.  Psychiatric:        Behavior: Behavior normal.   RECENT LABS AND TESTS: BMET    Component Value Date/Time   NA 141 06/17/2019 1035   K 4.0 06/17/2019 1035   CL 105 06/17/2019 1035   CO2 22 06/17/2019 1035   GLUCOSE 99 06/17/2019 1035   BUN 15 06/17/2019 1035   CREATININE 0.72 06/17/2019 1035   CALCIUM 8.9 06/17/2019 1035   GFRNONAA 103 06/17/2019 1035   GFRAA 119 06/17/2019 1035   Lab Results  Component Value Date   HGBA1C 5.9 (H) 06/17/2019   HGBA1C 6.1 (H) 02/17/2019   Lab Results  Component Value Date   INSULIN 10.0 06/17/2019   CBC    Component Value Date/Time   WBC 9.0 01/15/2019 1427   WBC 12.6 (H) 05/16/2016 1048   RBC 4.54 01/15/2019 1427   RBC 4.42 05/16/2016 1048   HGB 11.3 01/15/2019 1427   HCT 35.4 01/15/2019 1427   PLT 385 01/15/2019 1427   MCV 78 (L) 01/15/2019 1427   MCH 24.9 (L) 01/15/2019 1427   MCH 25.6 (L) 05/16/2016 1048   MCHC 31.9 01/15/2019 1427   MCHC 32.3 05/16/2016 1048   RDW 13.6 01/15/2019 1427   LYMPHSABS 2,772  05/16/2016 1048   MONOABS 1,260 (H) 05/16/2016 1048   EOSABS 378 05/16/2016 1048   BASOSABS 0 05/16/2016 1048   Iron/TIBC/Ferritin/ %Sat No results found for: IRON, TIBC, FERRITIN, IRONPCTSAT Lipid Panel     Component Value Date/Time   CHOL 150 06/17/2019 1035   TRIG 177 (H) 06/17/2019 1035   HDL 48 06/17/2019 1035   CHOLHDL 3.7 02/17/2019 1100   LDLCALC 72 06/17/2019 1035   Hepatic Function Panel     Component Value Date/Time   PROT 6.7 06/17/2019 1035   ALBUMIN 4.0 06/17/2019 1035   AST 27 06/17/2019 1035   ALT 22 06/17/2019 1035   ALKPHOS 49 06/17/2019 1035   BILITOT <0.2 06/17/2019 1035      Component Value Date/Time   TSH 5.360 (H) 06/17/2019 1035   TSH 2.390 01/15/2019 1425   Results for SAVONNA, BIRCHMEIER (MRN [id number]) as of 07/05/2019 12:08  Ref. Range 06/17/2019 10:35  Vitamin D, 25-Hydroxy Latest Ref Range: 30.0 - 100.0 ng/mL 9.4 (L)   OBESITY BEHAVIORAL INTERVENTION VISIT  Today's visit was #2  Starting weight: 264 lbs Starting date: 06/17/2019 Today's weight: 258 lbs  Today's date: 07/01/2019 Total lbs lost to date: 6     07/01/2019  Height  (1.651 m)  Weight 258 lb (117 kg)  BMI (Calculated) 42.93  BLOOD PRESSURE - SYSTOLIC 126  BLOOD PRESSURE - DIASTOLIC 86   Body Fat % 51 %  Total Body Water (lbs) 90.4 lbs   ASK: We discussed the diagnosis of obesity with Julie Guerrero today and Julie Guerrero agreed to give [place] permission to discuss obesity behavioral modification therapy today.  ASSESS: Julie Guerrero has the diagnosis of obesity and her BMI today is 42.9. Julie Guerrero is in the action stage of change.   ADVISE: Josely was educated on the multiple health risks of obesity as well as the benefit of weight loss to improve her health. She was advised of the need for long term treatment and the importance of lifestyle modifications to improve her current health and to decrease her risk of future health problems.  AGREE: Multiple dietary  modification options and treatment options were discussed and  Taleyah agreed to follow the recommendations documented in the above note.  ARRANGE:  Julie BattiestVeronica was educated on the importance of frequent visits to treat obesity as outlined per CMS and USPSTF guidelines and agreed to schedule her next follow up appointment today.  Fernanda DrumI, Denise Haag, am acting as Energy managertranscriptionist for [employer] Energy[employer]el Brown, DO  I have reviewed the above documentation for accuracy and completeness, and I agree with the above. -Corinna CapraAngel Brown, DO

## 2019-07-06 ENCOUNTER — Ambulatory Visit (INDEPENDENT_AMBULATORY_CARE_PROVIDER_SITE_OTHER): Payer: BC Managed Care – PPO | Admitting: Psychology

## 2019-07-16 ENCOUNTER — Telehealth: Payer: Self-pay | Admitting: Family Medicine

## 2019-07-16 NOTE — Telephone Encounter (Signed)
Dismissal letter in guarantor snapshot  °

## 2019-07-21 ENCOUNTER — Ambulatory Visit (INDEPENDENT_AMBULATORY_CARE_PROVIDER_SITE_OTHER): Payer: BC Managed Care – PPO | Admitting: Physician Assistant

## 2019-07-27 ENCOUNTER — Other Ambulatory Visit (INDEPENDENT_AMBULATORY_CARE_PROVIDER_SITE_OTHER): Payer: Self-pay | Admitting: Bariatrics

## 2019-07-27 DIAGNOSIS — E559 Vitamin D deficiency, unspecified: Secondary | ICD-10-CM

## 2019-07-29 ENCOUNTER — Other Ambulatory Visit (INDEPENDENT_AMBULATORY_CARE_PROVIDER_SITE_OTHER): Payer: Self-pay | Admitting: Bariatrics

## 2019-07-29 DIAGNOSIS — R7303 Prediabetes: Secondary | ICD-10-CM

## 2019-08-27 ENCOUNTER — Encounter: Payer: BC Managed Care – PPO | Admitting: Family Medicine

## 2019-11-26 ENCOUNTER — Other Ambulatory Visit: Payer: Self-pay

## 2020-01-20 IMAGING — MG DIGITAL SCREENING BILAT W/ CAD
4 series · 4 of 4 positions shown · non-contrast
Comparison: None.

CLINICAL DATA: Screening.

EXAM:
DIGITAL SCREENING BILATERAL MAMMOGRAM WITH CAD

[L CC]
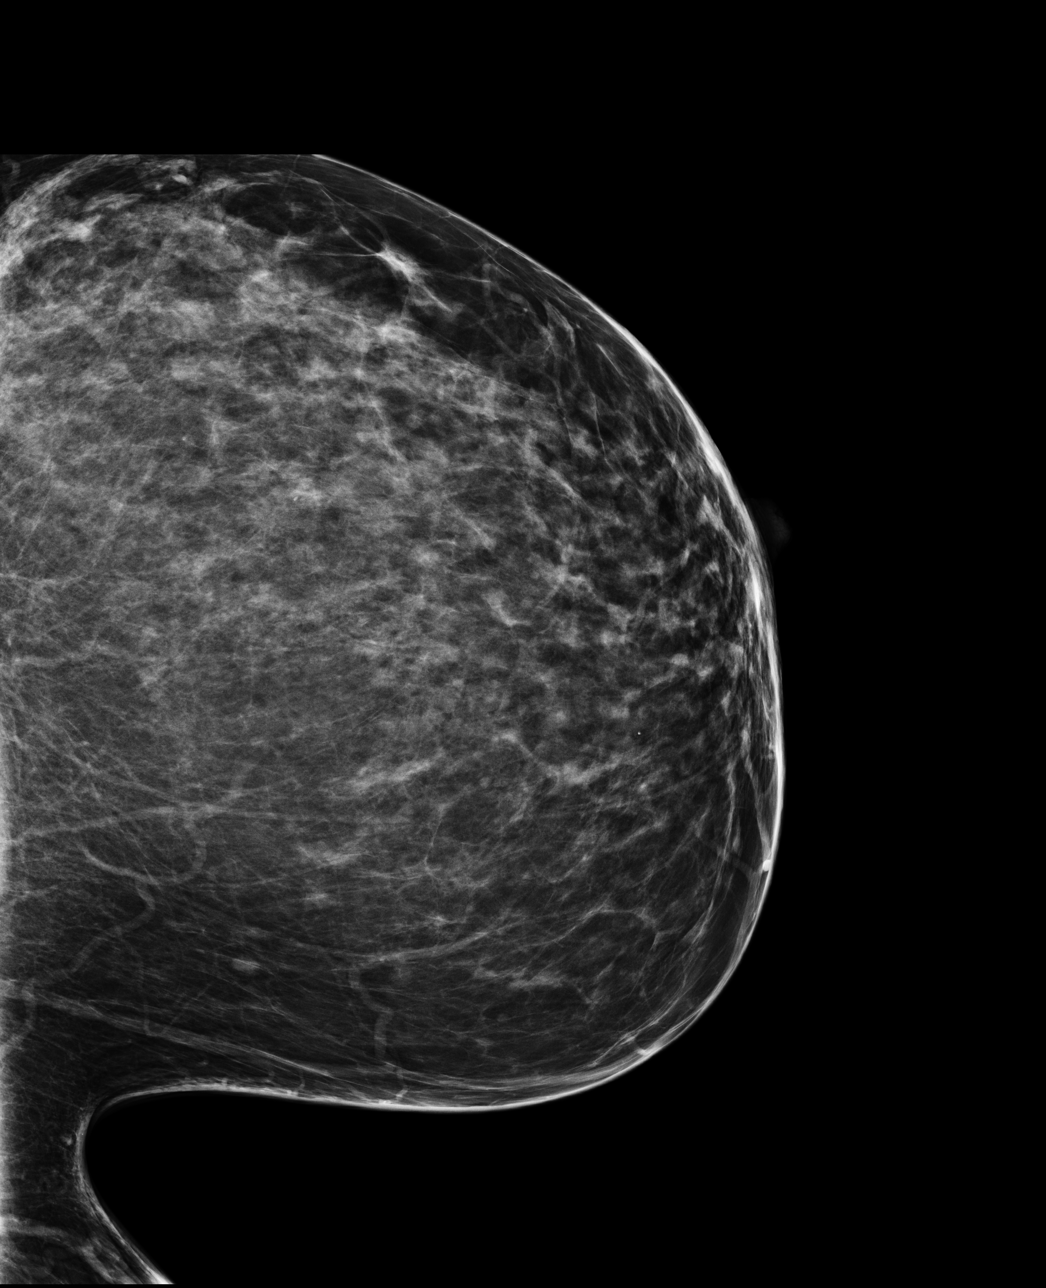

[L MLO]
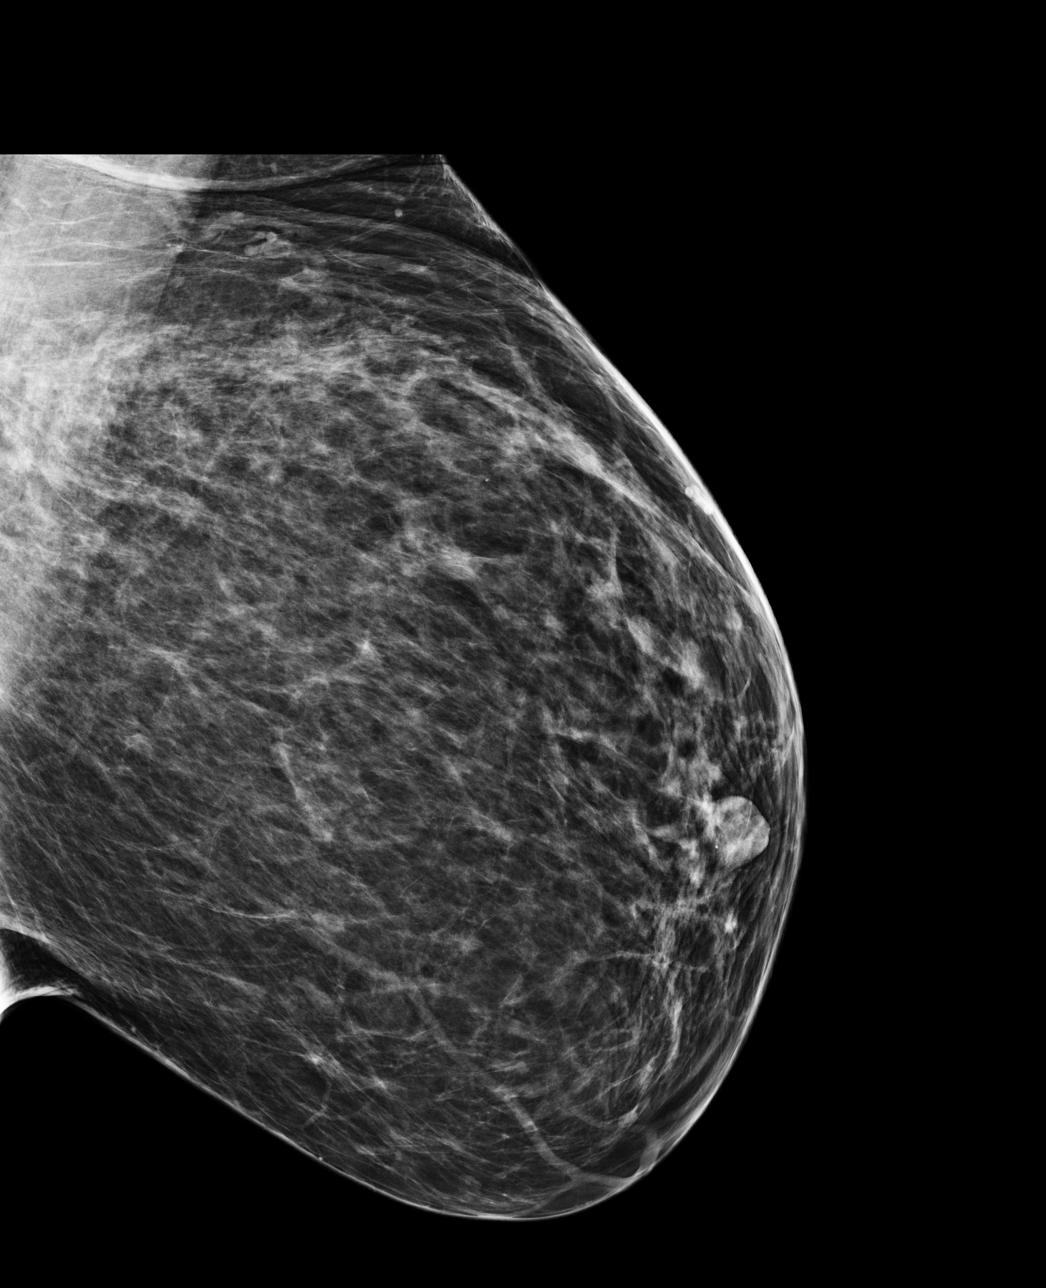

[R MLO]
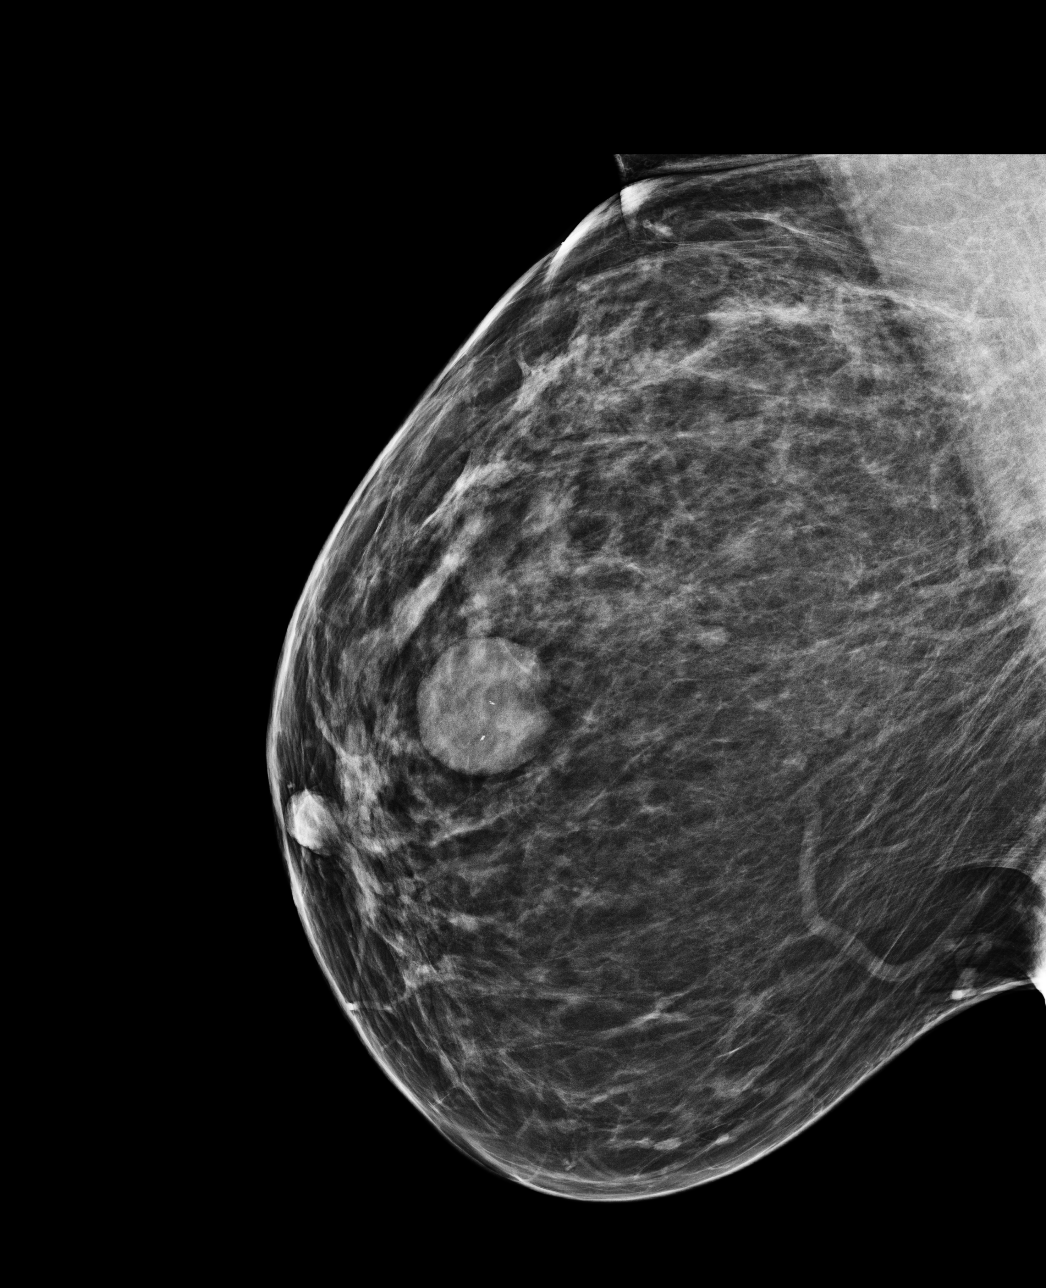

[R CC]
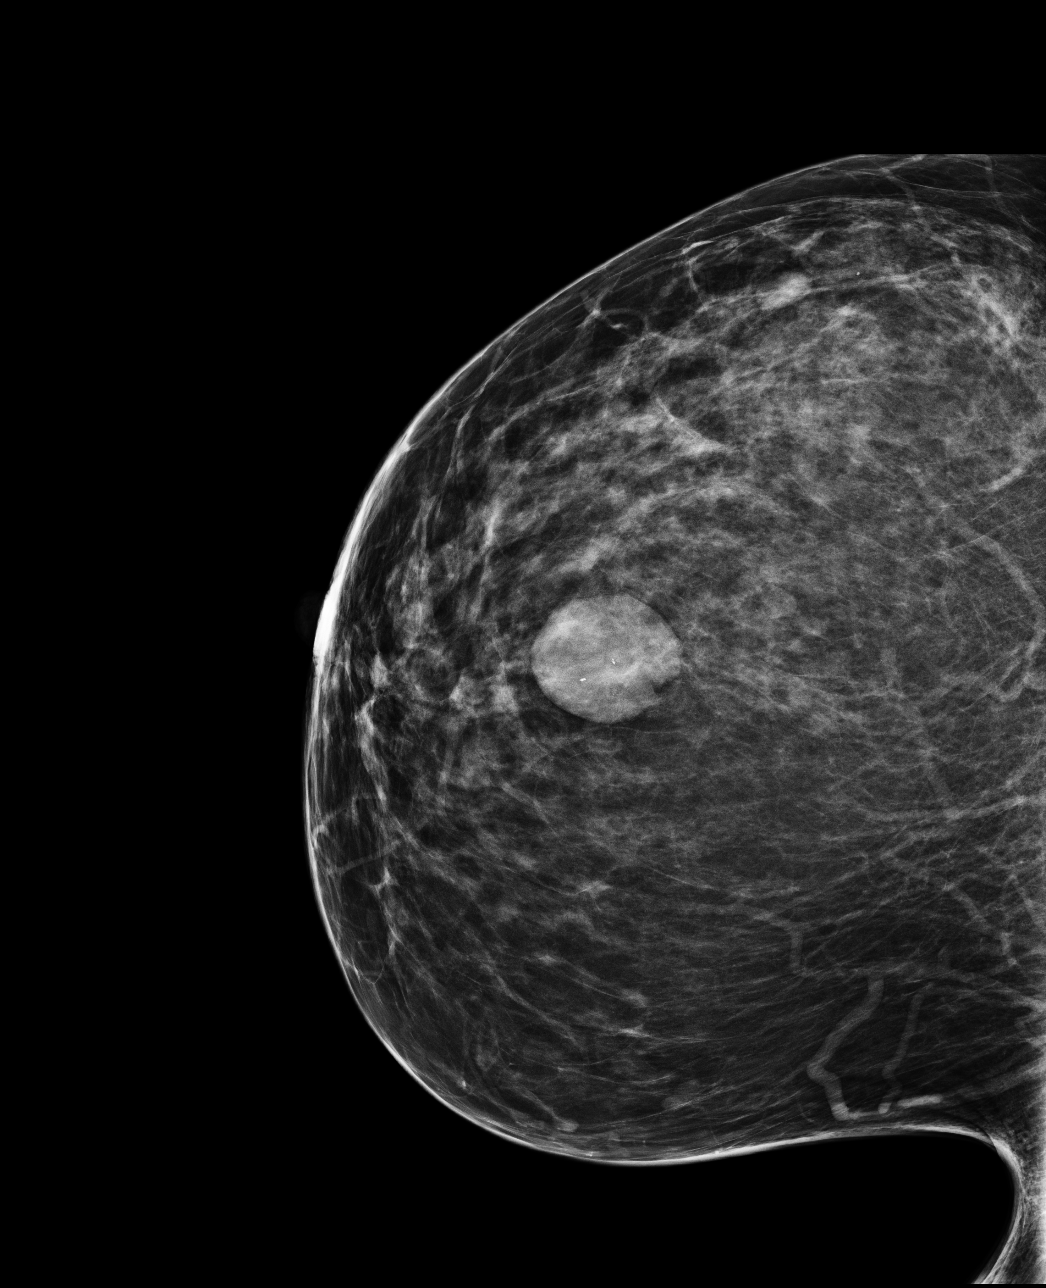

[4 of 4 positions shown; findings below may reference images not displayed]

ACR Breast Density Category b: There are scattered areas of
fibroglandular density.
FINDINGS: In the right breast, a possible mass warrants further evaluation. In
the left breast, no findings suspicious for malignancy. Images were
processed with CAD.
IMPRESSION: Further evaluation is suggested for possible mass in the right
breast.

RECOMMENDATION:
Diagnostic mammogram and possibly ultrasound of the right breast.
(Code:[id number])

The patient will be contacted regarding the findings, and additional
imaging will be scheduled.

BI-RADS CATEGORY  0: Incomplete. Need additional imaging evaluation
and/or prior mammograms for comparison.

## 2020-12-06 ENCOUNTER — Emergency Department (HOSPITAL_COMMUNITY): Payer: Self-pay

## 2020-12-06 ENCOUNTER — Inpatient Hospital Stay (HOSPITAL_COMMUNITY)
Admission: EM | Admit: 2020-12-06 | Discharge: 2020-12-08 | DRG: 872 | Disposition: A | Discharge status: Home / Self Care | Payer: Self-pay | Attending: Internal Medicine | Admitting: Internal Medicine

## 2020-12-06 ENCOUNTER — Other Ambulatory Visit: Payer: Self-pay

## 2020-12-06 DIAGNOSIS — F17210 Nicotine dependence, cigarettes, uncomplicated: Secondary | ICD-10-CM | POA: Diagnosis present

## 2020-12-06 DIAGNOSIS — N12 Tubulo-interstitial nephritis, not specified as acute or chronic: Secondary | ICD-10-CM

## 2020-12-06 DIAGNOSIS — Z7984 Long term (current) use of oral hypoglycemic drugs: Secondary | ICD-10-CM

## 2020-12-06 DIAGNOSIS — A4151 Sepsis due to Escherichia coli [E. coli]: Principal | ICD-10-CM | POA: Diagnosis present

## 2020-12-06 DIAGNOSIS — Z833 Family history of diabetes mellitus: Secondary | ICD-10-CM

## 2020-12-06 DIAGNOSIS — E781 Pure hyperglyceridemia: Secondary | ICD-10-CM | POA: Diagnosis present

## 2020-12-06 DIAGNOSIS — Z20822 Contact with and (suspected) exposure to covid-19: Secondary | ICD-10-CM | POA: Diagnosis present

## 2020-12-06 DIAGNOSIS — Z79899 Other long term (current) drug therapy: Secondary | ICD-10-CM

## 2020-12-06 DIAGNOSIS — Z83438 Family history of other disorder of lipoprotein metabolism and other lipidemia: Secondary | ICD-10-CM

## 2020-12-06 DIAGNOSIS — N390 Urinary tract infection, site not specified: Secondary | ICD-10-CM

## 2020-12-06 DIAGNOSIS — N10 Acute tubulo-interstitial nephritis: Secondary | ICD-10-CM | POA: Diagnosis present

## 2020-12-06 DIAGNOSIS — A419 Sepsis, unspecified organism: Secondary | ICD-10-CM

## 2020-12-06 DIAGNOSIS — R7303 Prediabetes: Secondary | ICD-10-CM | POA: Diagnosis present

## 2020-12-06 DIAGNOSIS — E669 Obesity, unspecified: Secondary | ICD-10-CM | POA: Diagnosis present

## 2020-12-06 DIAGNOSIS — N898 Other specified noninflammatory disorders of vagina: Secondary | ICD-10-CM | POA: Diagnosis present

## 2020-12-06 LAB — URINALYSIS, ROUTINE W REFLEX MICROSCOPIC
Bilirubin Urine: NEGATIVE
Glucose, UA: NEGATIVE mg/dL
Ketones, ur: NEGATIVE mg/dL
Nitrite: NEGATIVE
Protein, ur: NEGATIVE mg/dL
Specific Gravity, Urine: 1.009 (ref 1.005–1.030)
pH: 7 (ref 5.0–8.0)

## 2020-12-06 LAB — COMPREHENSIVE METABOLIC PANEL
ALT: 14 U/L (ref 0–44)
AST: 16 U/L (ref 15–41)
Albumin: 3.4 g/dL — ABNORMAL LOW (ref 3.5–5.0)
Alkaline Phosphatase: 41 U/L (ref 38–126)
Anion gap: 11 (ref 5–15)
BUN: 7 mg/dL (ref 6–20)
CO2: 23 mmol/L (ref 22–32)
Calcium: 8.9 mg/dL (ref 8.9–10.3)
Chloride: 98 mmol/L (ref 98–111)
Creatinine, Ser: 0.81 mg/dL (ref 0.44–1.00)
GFR, Estimated: >60 mL/min (ref >60)
Glucose, Bld: 122 mg/dL — ABNORMAL HIGH (ref 70–99)
Potassium: 3.5 mmol/L (ref 3.5–5.1)
Sodium: 132 mmol/L — ABNORMAL LOW (ref 135–145)
Total Bilirubin: 0.5 mg/dL (ref 0.3–1.2)
Total Protein: 6.8 g/dL (ref 6.5–8.1)

## 2020-12-06 LAB — LACTIC ACID, PLASMA
Lactic Acid, Venous: 1.2 mmol/L (ref 0.5–1.9)
Lactic Acid, Venous: 1.3 mmol/L (ref 0.5–1.9)

## 2020-12-06 LAB — CBC WITH DIFFERENTIAL/PLATELET
Abs Immature Granulocytes: 0.13 10*3/uL — ABNORMAL HIGH (ref 0.00–0.07)
Basophils Absolute: 0.1 10*3/uL (ref 0.0–0.1)
Basophils Relative: 1 %
Eosinophils Absolute: 0.1 10*3/uL (ref 0.0–0.5)
Eosinophils Relative: 0 %
HCT: 35.5 % — ABNORMAL LOW (ref 36.0–46.0)
Hemoglobin: 10.7 g/dL — ABNORMAL LOW (ref 12.0–15.0)
Immature Granulocytes: 1 %
Lymphocytes Relative: 11 %
Lymphs Abs: 2.2 10*3/uL (ref 0.7–4.0)
MCH: 25.4 pg — ABNORMAL LOW (ref 26.0–34.0)
MCHC: 30.1 g/dL (ref 30.0–36.0)
MCV: 84.1 fL (ref 80.0–100.0)
Monocytes Absolute: 2.2 10*3/uL — ABNORMAL HIGH (ref 0.1–1.0)
Monocytes Relative: 11 %
Neutro Abs: 16.3 10*3/uL — ABNORMAL HIGH (ref 1.7–7.7)
Neutrophils Relative %: 76 %
Platelets: 324 10*3/uL (ref 150–400)
RBC: 4.22 MIL/uL (ref 3.87–5.11)
RDW: 15 % (ref 11.5–15.5)
WBC: 21 10*3/uL — ABNORMAL HIGH (ref 4.0–10.5)
nRBC: 0 % (ref 0.0–0.2)

## 2020-12-06 LAB — RESP PANEL BY RT-PCR (FLU A&B, COVID) ARPGX2
Influenza A by PCR: NEGATIVE
Influenza B by PCR: NEGATIVE
SARS Coronavirus 2 by RT PCR: NEGATIVE

## 2020-12-06 LAB — APTT: aPTT: 26 seconds (ref 24–36)

## 2020-12-06 LAB — HCG, QUANTITATIVE, PREGNANCY: hCG, Beta Chain, Quant, S: 1 m[IU]/mL (ref <5)

## 2020-12-06 LAB — PROTIME-INR
INR: 1.1 (ref 0.8–1.2)
Prothrombin Time: 13.8 seconds (ref 11.4–15.2)

## 2020-12-06 MED ORDER — ACETAMINOPHEN 650 MG RE SUPP: 650.0000 mg | Freq: Four times a day (QID) | RECTAL | Status: DC | PRN

## 2020-12-06 MED ORDER — KETOROLAC TROMETHAMINE 15 MG/ML IJ SOLN
15.0000 mg | Freq: Once | INTRAMUSCULAR | Status: AC
  Administered 2020-12-06: 15 mg via INTRAVENOUS
  Filled 2020-12-06: qty 1

## 2020-12-06 MED ORDER — LACTATED RINGERS IV SOLN: INTRAVENOUS | Status: DC

## 2020-12-06 MED ORDER — ACETAMINOPHEN 325 MG PO TABS
650.0000 mg | ORAL_TABLET | Freq: Once | ORAL | Status: AC
Start: 2020-12-06 — End: 2020-12-06
  Administered 2020-12-06: 650 mg via ORAL
  Filled 2020-12-06: qty 2

## 2020-12-06 MED ORDER — POLYETHYLENE GLYCOL 3350 17 G PO PACK: 17.0000 g | PACK | Freq: Every day | ORAL | Status: DC | PRN

## 2020-12-06 MED ORDER — ONDANSETRON HCL 4 MG PO TABS: 4.0000 mg | ORAL_TABLET | Freq: Four times a day (QID) | ORAL | Status: DC | PRN

## 2020-12-06 MED ORDER — LACTATED RINGERS IV BOLUS (SEPSIS)
1000.0000 mL | Freq: Once | INTRAVENOUS | Status: AC
  Administered 2020-12-06: 1000 mL via INTRAVENOUS

## 2020-12-06 MED ORDER — SODIUM CHLORIDE 0.9 % IV SOLN
1.0000 g | INTRAVENOUS | Status: DC
  Administered 2020-12-06 – 2020-12-07 (×2): 1 g via INTRAVENOUS
  Filled 2020-12-06 (×2): qty 10

## 2020-12-06 MED ORDER — IOHEXOL 300 MG/ML  SOLN
100.0000 mL | Freq: Once | INTRAMUSCULAR | Status: AC | PRN
  Administered 2020-12-06: 100 mL via INTRAVENOUS

## 2020-12-06 MED ORDER — LACTATED RINGERS IV SOLN: INTRAVENOUS | Status: AC

## 2020-12-06 MED ORDER — ONDANSETRON HCL 4 MG/2ML IJ SOLN: 4.0000 mg | Freq: Four times a day (QID) | INTRAMUSCULAR | Status: DC | PRN

## 2020-12-06 MED ORDER — ENOXAPARIN SODIUM 40 MG/0.4ML ~~LOC~~ SOLN
40.0000 mg | SUBCUTANEOUS | Status: DC
  Administered 2020-12-07: 40 mg via SUBCUTANEOUS
  Filled 2020-12-06 (×2): qty 0.4

## 2020-12-06 MED ORDER — ACETAMINOPHEN 500 MG PO TABS
1000.0000 mg | ORAL_TABLET | Freq: Four times a day (QID) | ORAL | Status: DC | PRN
  Administered 2020-12-07: 1000 mg via ORAL
  Filled 2020-12-06: qty 2

## 2020-12-06 NOTE — Sepsis Progress Note (Signed)
elink monitoring code sepsis.  

## 2020-12-06 NOTE — ED Notes (Signed)
Pt up to pt restroom with steady gait.

## 2020-12-06 NOTE — ED Provider Notes (Addendum)
[facility] EMERGENCY DEPARTMENT Provider Note   CSN: [id number] Arrival date & time: 12/06/20  1150     History No chief complaint on file.   Julie Guerrero is a 45 y.o. female.  HPI   Patient presents with fever, chills, dysuria, right lower back pain and increased urinary frequency. Patient reports symptoms started one week ago, but become worse last night while in bed. She describes the pain as sharp, and states it is worse with pressure. She denies any radiation. She has tried Advil for the pain, but has had only minimal relief. She denies any recent antibiotic usage or history of UTI. Patient reports she is update to date on COVID vaccine and flu. She denies any nausea, vomiting, hematuria, vaginal discharge, SOB, chest pain or abdominal pain. She endorses headache.   Past Medical History:  Diagnosis Date  . Chronic low back pain   . Constipation   . Edema, lower extremity   . Hernia, inguinal, right   . Hypertriglyceridemia 02/18/2019  . Morbid obesity (HCC) 02/18/2019  . Obesity   . Prediabetes 02/18/2019  . SOB (shortness of breath)     Patient Active Problem List   Diagnosis Date Noted  . Vitamin D deficiency 06/21/2019  . Elevated TSH 06/21/2019  . Prediabetes 02/18/2019  . Morbid obesity (HCC) 02/18/2019  . Hypertriglyceridemia 02/18/2019  . 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 Hernia, inguinal, right   . Eczema 01/15/2019  . Chronic constipation 01/15/2019  . Preop examination 01/15/2019  . Right inguinal hernia 01/15/2019    Past Surgical History:  Procedure Laterality Date  . TUBAL LIGATION       OB History    Gravida  3   Para  3   Term      Preterm      AB      Living        SAB      IAB      Ectopic      Multiple      Live Births              Family History  Problem Relation Age of Onset  . Hypertension Mother   .  Asthma Mother   . Diabetes Mother   . High Cholesterol Mother   . High blood pressure Mother   . Obesity Mother   . Thyroid disease Maternal Grandmother   . Diabetes Maternal Grandmother   . Other Father        unknown  . Diabetes Father   . Obesity Father   . Heart disease Neg Hx   . Stroke Neg Hx     Social History   Tobacco Use  . Smoking status: Light Tobacco Smoker    Packs/day: 0.10    Years: 6.00    Pack years: 0.60    Types: Cigarettes  . Smokeless tobacco: Never Used  Vaping Use  . Vaping Use: Never used  Substance Use Topics  . Alcohol use: Yes    Comment: occ  . Drug use: No    Home Medications Prior to Admission medications   Medication Sig Start Date End Date Taking? Authorizing Provider  ibuprofen (ADVIL) 100 MG/5ML suspension Take 200 mg by mouth every 4 (four) hours as needed.    [provider]  metFORMIN (GLUCOPHAGE) 500 MG tablet Take 1 tablet (500 mg total) by mouth 2 (two)  times daily with a meal. 07/01/19   Corinna Capra A, DO  Vitamin D, Ergocalciferol, (DRISDOL) 1.25 MG (50000 UT) CAPS capsule Take 1 capsule (50,000 Units total) by mouth every 7 (seven) days. 07/01/19   Roswell Nickel, DO    Allergies    Patient has no known allergies.  Review of Systems    All other systems were reviewed and are negative except for stated in HPI.  Physical Exam Updated Vital Signs BP 128/80   Pulse (!) 102   Temp (!) 102.2 F (39 C) (Oral)   Resp 18   SpO2 100%   Physical Exam Vitals and nursing note reviewed. Exam conducted with a chaperone present.  Constitutional:      General: She is not in acute distress.    Appearance: Normal appearance.  HENT:     Head: Normocephalic and atraumatic.  Eyes:     General: No scleral icterus.       Right eye: No discharge.        Left eye: No discharge.     Extraocular Movements: Extraocular movements intact.     Pupils: Pupils are equal, round, and reactive to light.  Cardiovascular:     Rate  and Rhythm: Regular rhythm. Tachycardia present.     Pulses: Normal pulses.     Heart sounds: Normal heart sounds. No murmur heard. No friction rub. No gallop.   Pulmonary:     Effort: Pulmonary effort is normal. No respiratory distress.     Breath sounds: Normal breath sounds.  Abdominal:     General: Abdomen is flat. Bowel sounds are normal. There is no distension.     Palpations: Abdomen is soft.     Tenderness: There is no abdominal tenderness. There is right CVA tenderness.  Skin:    General: Skin is warm and dry.     Coloration: Skin is not jaundiced.  Neurological:     Mental Status: She is alert. Mental status is at baseline.     Coordination: Coordination normal.     ED Results / Procedures / Treatments   Labs (all labs ordered are listed, but only abnormal results are displayed) Labs Reviewed  URINALYSIS, ROUTINE W REFLEX MICROSCOPIC - Abnormal; Notable for the following components:      Result Value   Hgb urine dipstick MODERATE (*)    Leukocytes,Ua LARGE (*)    Bacteria, UA MANY (*)    All other components within normal limits  CBC WITH DIFFERENTIAL/PLATELET - Abnormal; Notable for the following components:   WBC 21.0 (*)    Hemoglobin 10.7 (*)    HCT 35.5 (*)    MCH 25.4 (*)    Neutro Abs 16.3 (*)    Monocytes Absolute 2.2 (*)    Abs Immature Granulocytes 0.13 (*)    All other components within normal limits  COMPREHENSIVE METABOLIC PANEL - Abnormal; Notable for the following components:   Sodium 132 (*)    Glucose, Bld 122 (*)    Albumin 3.4 (*)    All other components within normal limits  RESP PANEL BY RT-PCR (FLU A&B, COVID) ARPGX2  LACTIC ACID, PLASMA  LACTIC ACID, PLASMA    EKG None  Radiology No results found.  Procedures Procedures   Medications Ordered in ED Medications  acetaminophen (TYLENOL) tablet 650 mg (650 mg Oral Given 12/06/20 1238)    ED Course  I have reviewed the triage vital signs and the nursing notes.  Pertinent  labs & imaging results that  were available during my care of the patient were reviewed by me and considered in my medical decision making (see chart for details).    MDM Rules/Calculators/A&P                          Patient is a 45 year old patient presenting for fever, dysuria and increased urinary frequency. She is tachycardic to 102. Fever to 102.4. WBC elevated to 21 with NEUT 16.3. She meets SIRS Criteria without shock. Patient given fluids and ceftriaxone in the ED. Critical care is needed for this patient to prevent shock and poor outcome. Will admit patient to hospital for further management of sepsis.   Discussed HPI, physical exam and plan of care for this patient with attending Eber Hong. The attending physician evaluated this patient as part of a shared visit and agrees with plan of care.  Final Clinical Impression(s) / ED Diagnoses Final diagnoses:  None    Rx / DC Orders ED Discharge Orders    None       Theron Arista, PA-C 12/06/20 [address], [place] 12/07/20 1747    Eber Hong, MD 12/16/20 1452

## 2020-12-06 NOTE — ED Notes (Signed)
Patient transported to CT 

## 2020-12-06 NOTE — ED Provider Notes (Signed)
Medical screening examination/treatment/procedure(s) were conducted as a shared visit with non-physician practitioner(s) and myself.  I personally evaluated the patient during the encounter.  Clinical Impression:   Final diagnoses:  Sepsis, due to unspecified organism, unspecified whether acute organ dysfunction present [facility])  Pyelonephritis   Patient is a 45 year old female, she denies any chronic medical problems whatsoever, she states that over the last week she has had what she described as flulike symptoms including fevers chills and body aches.  She had initially had some difficulty with urination which she describes as a burning or frequent urination which eventually turned into some right lower back pain, yesterday she was having significant chills and fevers and increasing right lower back pain.  She also states that her urine started to smell "eggy".  She describes no prior history of urinary tract infections, she has not had any abdominal discomfort, the symptoms have been persistent gradually worsening and have now become severe.  The patient was noted to be febrile and tachycardic and initial laboratory work-up showed that she had a normal lactic acid but did have a white blood cell count of 21,000.  She is tachycardic to 110 bpm, febrile to 102.2 and normotensive and 128/80.  The patient meets criteria for sepsis because of leukocytosis, fever, tachycardia and a source of infection which is likely pyelonephritis given the urinalysis which shows many bacteria.  A CT scan will be ordered to rule out any renal abscess or other complicating factors, the patient will be given antibiotics and fluids and will be admitted to the hospital treated for sepsis.  Blood cultures were ordered prior to antibiotics being given  The patient is critically ill with sepsis but at this time thankfully is not in shock.  .Critical Care Performed by: Eber Hong, MD Authorized by: Eber Hong, MD    Critical care provider statement:    Critical care time (minutes):  35   Critical care time was exclusive of:  Separately billable procedures and treating other patients and teaching time   Critical care was necessary to treat or prevent imminent or life-threatening deterioration of the following conditions:  Sepsis   Critical care was time spent personally by me on the following activities:  Blood draw for specimens, development of treatment plan with patient or surrogate, discussions with consultants, evaluation of patient's response to treatment, examination of patient, obtaining history from patient or surrogate, ordering and performing treatments and interventions, ordering and review of laboratory studies, ordering and review of radiographic studies, pulse oximetry, re-evaluation of patient's condition and review of old charts Comments:            Eber Hong, MD 12/16/20 1451

## 2020-12-06 NOTE — ED Triage Notes (Signed)
Emergency Medicine Provider Triage Evaluation Note  Julie Guerrero , a 45 y.o. female  was evaluated in triage.  Pt complains of dysuria x 1 week, fever/chills onset last night .  Review of Systems  Positive: Fever, chills, dysuria, right low back pain Negative: Vomiting, changes bowels, URI symptoms  Physical Exam  BP (!) 156/96 (BP Location: Left Arm)   Pulse (!) 112   Temp (!) 102.2 F (39 C) (Oral)   Resp (!) 22   SpO2 98%  Gen:   Awake, no distress   HEENT:  Atraumatic  Resp:  Normal effort  Cardiac:  Normal rate  Abd:   Nondistended, nontender  MSK:   Moves extremities without difficulty, no CVA tenderness Neuro:  Speech clear   Medical Decision Making  Medically screening exam initiated at 12:28 PM.  Appropriate orders placed.  TYKIRA WACHS was informed that the remainder of the evaluation will be completed by another provider, this initial triage assessment does not replace that evaluation, and the importance of remaining in the ED until their evaluation is complete.  Clinical Impression     Jeannie Fend, PA-C 12/06/20 1235

## 2020-12-06 NOTE — H&P (Signed)
History and Physical    Julie Guerrero IFO:[id number] DOB: [birth year] DOA: 12/06/2020  PCP: Pcp, No  Patient coming from: Home  I have personally briefly reviewed patient's old medical records in [facility]  Chief Complaint: Fevers, right flank pain  HPI: Julie Guerrero is a 45 y.o. female with medical history significant for obesity and prediabetes who presents to the ED for evaluation of fevers and right flank pain.  Patient reports 1 week of urinary frequency and urgency with associated chills and night sweats.  She has had right flank pain.  Last night she developed fevers.  She has had some mild nausea without emesis.  She otherwise denies any chest pain, dyspnea, cough, abdominal pain.  She is not currently taking any medications other than ibuprofen as needed.  ED Course:  Initial vitals showed BP 156/96, pulse 112, RR 22, temp 102.2 F, SPO2 98% on room air.  Labs show WBC 21.0, hemoglobin 10.7, platelets 324,000, sodium 132, potassium 3.5, bicarb 23, BUN 7, creatinine 0.81, serum glucose 122, LFTs within normal limits, lactic acid 1.2 and 1.3 on repeat.  Urinalysis shows negative nitrates, large leukocytes, 6-10 RBC/hpf, 11-20 WBC/hpf, many bacteria on microscopy.  Urine and blood cultures obtained and pending.  SARS-CoV-2 PCR panel is negative.  Influenza A/B PCR is negative.  Quantitative Beta-hCG is <5.  Portable chest x-ray is negative for focal consolidation, edema, or effusion.  CT abdomen/pelvis with contrast showed changes of the left kidney suggestive of pyelonephritis, mild fullness of the left renal collecting system to the level of 2 nonobstructive stacked stones measuring 2-3 mm at the UPJ.  Additional punctate bilateral nonobstructive renal stones noted.  Bilateral cystic structures in the anterolateral vaginal wall below the level of the pubic symphysis, small hiatal hernia, and fat-containing right inguinal hernia also noted.  Patient was given 1 L LR and  IV ceftriaxone.  The hospitalist service was consulted to admit for further evaluation and management.  Review of Systems: All systems reviewed and are negative except as documented in history of present illness above.   Past Medical History:  Diagnosis Date  . Chronic low back pain   . Constipation   . Edema, lower extremity   . Hernia, inguinal, right   . Hypertriglyceridemia 02/18/2019  . Morbid obesity (HCC) 02/18/2019  . Obesity   . Prediabetes 02/18/2019  . SOB (shortness of breath)     Past Surgical History:  Procedure Laterality Date  . TUBAL LIGATION      Social History:  reports that she has been smoking cigarettes. She has a 0.60 pack-year smoking history. She has never used smokeless tobacco. She reports current alcohol use. She reports that she does not use drugs.  No Known Allergies  Family History  Problem Relation Age of Onset  . Hypertension Mother   . Asthma Mother   . Diabetes Mother   . High Cholesterol Mother   . High blood pressure Mother   . Obesity Mother   . Thyroid disease Maternal Grandmother   . Diabetes Maternal Grandmother   . Other Father        unknown  . Diabetes Father   . Obesity Father   . Heart disease Neg Hx   . Stroke Neg Hx      Prior to Admission medications   Medication Sig Start Date End Date Taking? Authorizing Provider  ibuprofen (ADVIL) 100 MG/5ML suspension Take 200 mg by mouth every 4 (four) hours as needed.  [provider]  metFORMIN (GLUCOPHAGE) 500 MG tablet Take 1 tablet (500 mg total) by mouth 2 (two) times daily with a meal. 07/01/19   Corinna Capra A, DO  Vitamin D, Ergocalciferol, (DRISDOL) 1.25 MG (50000 UT) CAPS capsule Take 1 capsule (50,000 Units total) by mouth every 7 (seven) days. 07/01/19   Roswell Nickel, DO    Physical Exam: Vitals:   12/06/20 2100 12/06/20 2105 12/06/20 2215 12/06/20 2230  BP:  (!) 151/75 (!) 149/82 (!) 148/89  Pulse:  94 97 94  Resp: 18 17 19    Temp:      TempSrc:       SpO2:  100% 97% 99%   Constitutional: Obese woman resting supine in bed, NAD, calm, comfortable Eyes: PERRL, lids and conjunctivae normal ENMT: Mucous membranes are moist. Posterior pharynx clear of any exudate or lesions.Normal dentition.  Neck: normal, supple, no masses. Respiratory: clear to auscultation bilaterally, no wheezing, no crackles. Normal respiratory effort. No accessory muscle use.  Cardiovascular: Regular rate and rhythm, no murmurs / rubs / gallops. No extremity edema. 2+ pedal pulses. Abdomen: Mild right CVA tenderness otherwise no abdominal tenderness, no masses palpated. No hepatosplenomegaly. Musculoskeletal: no clubbing / cyanosis. No joint deformity upper and lower extremities. Good ROM, no contractures. Normal muscle tone.  Skin: no rashes, lesions, ulcers. No induration Neurologic: CN 2-12 grossly intact. Sensation intact. Strength 5/5 in all 4.  Psychiatric: Normal judgment and insight. Alert and oriented x 3. Normal mood.   Labs on Admission: I have personally reviewed following labs and imaging studies  CBC: Recent Labs  Lab 12/06/20 1256  WBC 21.0*  NEUTROABS 16.3*  HGB 10.7*  HCT 35.5*  MCV 84.1  PLT 324   Basic Metabolic Panel: Recent Labs  Lab 12/06/20 1256  NA 132*  K 3.5  CL 98  CO2 23  GLUCOSE 122*  BUN 7  CREATININE 0.81  CALCIUM 8.9   GFR: CrCl cannot be calculated (Unknown ideal weight.). Liver Function Tests: Recent Labs  Lab 12/06/20 1256  AST 16  ALT 14  ALKPHOS 41  BILITOT 0.5  PROT 6.8  ALBUMIN 3.4*   No results for input(s): LIPASE, AMYLASE in the last 168 hours. No results for input(s): AMMONIA in the last 168 hours. Coagulation Profile: Recent Labs  Lab 12/06/20 1813  INR 1.1   Cardiac Enzymes: No results for input(s): CKTOTAL, CKMB, CKMBINDEX, TROPONINI in the last 168 hours. BNP (last 3 results) No results for input(s): PROBNP in the last 8760 hours. HbA1C: No results for input(s): HGBA1C in the last  72 hours. CBG: No results for input(s): GLUCAP in the last 168 hours. Lipid Profile: No results for input(s): CHOL, HDL, LDLCALC, TRIG, CHOLHDL, LDLDIRECT in the last 72 hours. Thyroid Function Tests: No results for input(s): TSH, T4TOTAL, FREET4, T3FREE, THYROIDAB in the last 72 hours. Anemia Panel: No results for input(s): VITAMINB12, FOLATE, FERRITIN, TIBC, IRON, RETICCTPCT in the last 72 hours. Urine analysis:    Component Value Date/Time   COLORURINE YELLOW 12/06/2020 1235   APPEARANCEUR CLEAR 12/06/2020 1235   LABSPEC 1.009 12/06/2020 1235   PHURINE 7.0 12/06/2020 1235   GLUCOSEU NEGATIVE 12/06/2020 1235   HGBUR MODERATE (A) 12/06/2020 1235   BILIRUBINUR NEGATIVE 12/06/2020 1235   BILIRUBINUR n 05/16/2016 1204   KETONESUR NEGATIVE 12/06/2020 1235   PROTEINUR NEGATIVE 12/06/2020 1235   UROBILINOGEN negative 05/16/2016 1204   NITRITE NEGATIVE 12/06/2020 1235   LEUKOCYTESUR LARGE (A) 12/06/2020 1235    Radiological Exams on  Admission: DG Chest Port 1 View  Result Date: 12/06/2020 CLINICAL DATA:  Fever. EXAM: PORTABLE CHEST 1 VIEW COMPARISON:  01/16/2012 FINDINGS: The cardiomediastinal contours are normal. The lungs are clear. Pulmonary vasculature is normal. No consolidation, pleural effusion, or pneumothorax. No acute osseous abnormalities are seen. IMPRESSION: No acute chest findings. Electronically Signed   By: Narda Rutherford M.D.   On: 12/06/2020 18:58    EKG: Personally reviewed. Sinus tachycardia, rate 101, T wave flattening V3-V6, aVF, T wave inversion in lead III.  Not significantly changed when compared to prior.  Assessment/Plan Principal Problem:   Sepsis due to urinary tract infection (HCC)   Julie Guerrero is a 45 y.o. female with medical history significant for obesity and prediabetes who is admitted with sepsis due to pyelonephritis.  Sepsis due to acute pyelonephritis, POA: Patient presenting with WBC 21.0, temp 102.2 F, pulse 112, RR 22, and urinary  infectious source.  CT imaging suggestive of left-sided pyelonephritis with nonobstructive nephrolithiasis. -Continue empiric IV ceftriaxone -Follow urine and blood cultures -Continue IV fluid hydration overnight  Vaginal wall cystic structures: Bilateral cystic structures in the anterolateral vaginal wall noted on CT imaging.  Nonemergent outpatient pelvic ultrasound suggested.  DVT prophylaxis: Lovenox Code Status: Full code, confirmed with patient Family Communication: Discussed with patient, she has discussed with family Disposition Plan: From home and likely discharge to home in 1-2 days Consults called: None Level of care: Med-Surg Admission status:  Status is: Observation  The patient remains OBS appropriate and will d/c before 2 midnights.  Dispo: The patient is from: Home              Anticipated d/c is to: Home              Patient currently is not medically stable to d/c.   Difficult to place patient No  Darreld Mclean MD [facility]  If 7PM-7AM, please contact night-coverage [email or web address]  12/06/2020, 11:15 PM

## 2020-12-06 NOTE — ED Triage Notes (Signed)
Pt her e from home with c/o fever and low back pain along urinary freq and urgency

## 2020-12-07 LAB — CBC
HCT: 31 % — ABNORMAL LOW (ref 36.0–46.0)
Hemoglobin: 9.6 g/dL — ABNORMAL LOW (ref 12.0–15.0)
MCH: 25.7 pg — ABNORMAL LOW (ref 26.0–34.0)
MCHC: 31 g/dL (ref 30.0–36.0)
MCV: 83.1 fL (ref 80.0–100.0)
Platelets: 277 10*3/uL (ref 150–400)
RBC: 3.73 MIL/uL — ABNORMAL LOW (ref 3.87–5.11)
RDW: 15 % (ref 11.5–15.5)
WBC: 17.9 10*3/uL — ABNORMAL HIGH (ref 4.0–10.5)
nRBC: 0 % (ref 0.0–0.2)

## 2020-12-07 LAB — BASIC METABOLIC PANEL
Anion gap: 11 (ref 5–15)
BUN: 5 mg/dL — ABNORMAL LOW (ref 6–20)
CO2: 23 mmol/L (ref 22–32)
Calcium: 8.4 mg/dL — ABNORMAL LOW (ref 8.9–10.3)
Chloride: 99 mmol/L (ref 98–111)
Creatinine, Ser: 0.78 mg/dL (ref 0.44–1.00)
GFR, Estimated: >60 mL/min (ref >60)
Glucose, Bld: 112 mg/dL — ABNORMAL HIGH (ref 70–99)
Potassium: 3.2 mmol/L — ABNORMAL LOW (ref 3.5–5.1)
Sodium: 133 mmol/L — ABNORMAL LOW (ref 135–145)

## 2020-12-07 LAB — HIV ANTIBODY (ROUTINE TESTING W REFLEX): HIV Screen 4th Generation wRfx: NONREACTIVE

## 2020-12-07 LAB — HEMOGLOBIN A1C
Hgb A1c MFr Bld: 6 % — ABNORMAL HIGH (ref 4.8–5.6)
Mean Plasma Glucose: 125.5 mg/dL

## 2020-12-07 MED ORDER — POTASSIUM CHLORIDE CRYS ER 20 MEQ PO TBCR
40.0000 meq | EXTENDED_RELEASE_TABLET | Freq: Once | ORAL | Status: AC
  Administered 2020-12-07: 40 meq via ORAL
  Filled 2020-12-07: qty 2

## 2020-12-07 NOTE — Progress Notes (Signed)
PROGRESS NOTE    Julie Guerrero  YOV:[id number] DOB: [birth year] DOA: 12/06/2020 PCP: Pcp, No  Brief Narrative: 45 year old female with obesity and prediabetes presented to the ED with fevers and right flank pain, on further evaluation she was noted to be febrile tachycardic with abnormal urinalysis and CT abdomen concerning for Pyelonephritis, no obstructing calculi noted  Assessment & Plan:   Sepsis poa Pyelonephritis Nausea and vomiting -Clinically improving, WBC down to 18K -Continue IV ceftriaxone today -Follow-up urine and blood cultures  Vaginal cysts -Noted incidentally on CT -Recommend nonurgent GYN follow-up for this  Borderline diabetes mellitus -Hemoglobin A1c is 6.0 -Recommended weight loss and lifestyle modification  Obesity -As above  DVT prophylaxis: Lovenox Code Status: Full code Family Communication: Discussed patient in detail, no family at bedside Disposition Plan:  Status is: Observation  The patient will require care spanning > 2 midnights and should be moved to inpatient because: Inpatient level of care appropriate due to severity of illness Admitted with sepsis, pyelonephritis, vomiting limits transition to po antibiotics today, await blood and urine culture data Dispo: The patient is from: Home              Anticipated d/c is to: Home              Patient currently is not medically stable to d/c.   Difficult to place patient No       Procedures:   Antimicrobials:    Subjective: -Starting to feel better, fevers and chills are improving, right flank pain starting to get better  Objective: Vitals:   12/07/20 0402 12/07/20 0545 12/07/20 0606 12/07/20 0835  BP: (!) 133/92 (!) 136/92 (!) 142/85 (!) 145/83  Pulse: 79 86 98   Resp: 18 18 20    Temp:  99.2 F (37.3 C) 99.4 F (37.4 C)   TempSrc:  Oral Oral   SpO2: 99% 99% 100%     Intake/Output Summary (Last 24 hours) at 12/07/2020 1003 Last data filed at 12/07/2020 0008 Gross per 24  hour  Intake 1550 ml  Output --  Net 1550 ml   There were no vitals filed for this visit.  Examination:  General exam: Pleasant obese female sitting up in bed, AAOx3, no distress CVS: S1-S2, regular rate rhythm Lungs: Clear bilaterally Abdomen: Soft, nontender, right flank tenderness, bowel sounds present Extremities: No edema Skin: No rashes on exposed skin   Data Reviewed:   CBC: Recent Labs  Lab 12/06/20 1256 12/07/20 0204  WBC 21.0* 17.9*  NEUTROABS 16.3*  --   HGB 10.7* 9.6*  HCT 35.5* 31.0*  MCV 84.1 83.1  PLT 324 277   Basic Metabolic Panel: Recent Labs  Lab 12/06/20 1256 12/07/20 0204  NA 132* 133*  K 3.5 3.2*  CL 98 99  CO2 23 23  GLUCOSE 122* 112*  BUN 7 5*  CREATININE 0.81 0.78  CALCIUM 8.9 8.4*   GFR: CrCl cannot be calculated (Unknown ideal weight.). Liver Function Tests: Recent Labs  Lab 12/06/20 1256  AST 16  ALT 14  ALKPHOS 41  BILITOT 0.5  PROT 6.8  ALBUMIN 3.4*   No results for input(s): LIPASE, AMYLASE in the last 168 hours. No results for input(s): AMMONIA in the last 168 hours. Coagulation Profile: Recent Labs  Lab 12/06/20 1813  INR 1.1   Cardiac Enzymes: No results for input(s): CKTOTAL, CKMB, CKMBINDEX, TROPONINI in the last 168 hours. BNP (last 3 results) No results for input(s): PROBNP in the last 8760 hours. HbA1C: Recent  Labs    12/07/20 0205  HGBA1C 6.0*   CBG: No results for input(s): GLUCAP in the last 168 hours. Lipid Profile: No results for input(s): CHOL, HDL, LDLCALC, TRIG, CHOLHDL, LDLDIRECT in the last 72 hours. Thyroid Function Tests: No results for input(s): TSH, T4TOTAL, FREET4, T3FREE, THYROIDAB in the last 72 hours. Anemia Panel: No results for input(s): VITAMINB12, FOLATE, FERRITIN, TIBC, IRON, RETICCTPCT in the last 72 hours. Urine analysis:    Component Value Date/Time   COLORURINE YELLOW 12/06/2020 1235   APPEARANCEUR CLEAR 12/06/2020 1235   LABSPEC 1.009 12/06/2020 1235   PHURINE  7.0 12/06/2020 1235   GLUCOSEU NEGATIVE 12/06/2020 1235   HGBUR MODERATE (A) 12/06/2020 1235   BILIRUBINUR NEGATIVE 12/06/2020 1235   BILIRUBINUR n 05/16/2016 1204   KETONESUR NEGATIVE 12/06/2020 1235   PROTEINUR NEGATIVE 12/06/2020 1235   UROBILINOGEN negative 05/16/2016 1204   NITRITE NEGATIVE 12/06/2020 1235   LEUKOCYTESUR LARGE (A) 12/06/2020 1235   Sepsis Labs: @LABRCNTIP (procalcitonin:4,lacticidven:4)  ) Recent Results (from the past 240 hour(s))  Resp Panel by RT-PCR (Flu A&B, Covid) Nasopharyngeal Swab     Status: None   Collection Time: 12/06/20 12:34 PM   Specimen: Nasopharyngeal Swab; Nasopharyngeal(NP) swabs in vial transport medium  Result Value Ref Range Status   SARS Coronavirus 2 by RT PCR NEGATIVE NEGATIVE Final    Comment: (NOTE) SARS-CoV-2 target nucleic acids are NOT DETECTED.  The SARS-CoV-2 RNA is generally detectable in upper respiratory specimens during the acute phase of infection. The lowest concentration of SARS-CoV-2 viral copies this assay can detect is 138 copies/mL. A negative result does not preclude SARS-Cov-2 infection and should not be used as the sole basis for treatment or other patient management decisions. A negative result may occur with  improper specimen collection/handling, submission of specimen other than nasopharyngeal swab, presence of viral mutation(s) within the areas targeted by this assay, and inadequate number of viral copies(<138 copies/mL). A negative result must be combined with clinical observations, patient history, and epidemiological information. The expected result is Negative.  Fact Sheet for Patients:  12/08/20  Fact Sheet for Healthcare Providers:  BloggerCourse.com  This test is no t yet approved or cleared by the [place] FDA and  has been authorized for detection and/or diagnosis of SARS-CoV-2 by FDA under an Emergency Use Authorization (EUA).  This EUA will remain  in effect (meaning this test can be used) for the duration of the COVID-19 declaration under Section 564(b)(1) of the Act, 21 U.S.C.section 360bbb-3(b)(1), unless the authorization is terminated  or revoked sooner.       Influenza A by PCR NEGATIVE NEGATIVE Final   Influenza B by PCR NEGATIVE NEGATIVE Final    Comment: (NOTE) The Xpert Xpress SARS-CoV-2/FLU/RSV plus assay is intended as an aid in the diagnosis of influenza from Nasopharyngeal swab specimens and should not be used as a sole basis for treatment. Nasal washings and aspirates are unacceptable for Xpert Xpress SARS-CoV-2/FLU/RSV testing.  Fact Sheet for Patients: [place]  Fact Sheet for Healthcare Providers: BloggerCourse.com  This test is not yet approved or cleared by the [employer] FDA and has been authorized for detection and/or diagnosis of SARS-CoV-2 by FDA under an Emergency Use Authorization (EUA). This EUA will remain in effect (meaning this test can be used) for the duration of the COVID-19 declaration under Section 564(b)(1) of the Act, 21 U.S.C. section 360bbb-3(b)(1), unless the authorization is terminated or revoked.  Performed at [facility], [address], [place], [place]  Blood Culture (routine x 2)     Status: None (Preliminary result)   Collection Time: 12/06/20  6:13 PM   Specimen: Site Not Specified; Blood  Result Value Ref Range Status   Specimen Description SITE NOT SPECIFIED  Final   Special Requests   Final    BOTTLES DRAWN AEROBIC AND ANAEROBIC Blood Culture results may not be optimal due to an inadequate volume of blood received in culture bottles   Culture   Final    NO GROWTH < 12 HOURS Performed at [facility], [address] [place], [place] [address]    Report Status PENDING  Incomplete  Blood Culture (routine x 2)     Status: None (Preliminary result)   Collection  Time: 12/06/20  6:18 PM   Specimen: Site Not Specified; Blood  Result Value Ref Range Status   Specimen Description SITE NOT SPECIFIED  Final   Special Requests   Final    BOTTLES DRAWN AEROBIC AND ANAEROBIC Blood Culture results may not be optimal due to an inadequate volume of blood received in culture bottles   Culture   Final    NO GROWTH < 12 HOURS Performed at [facility], [address] [place], [place] [address]    Report Status PENDING  Incomplete     Radiology Studies: CT ABDOMEN PELVIS W CONTRAST  Result Date: 12/06/2020 CLINICAL DATA:  Dysuria x1 week with fever and chills EXAM: CT ABDOMEN AND PELVIS WITH CONTRAST TECHNIQUE: Multidetector CT imaging of the abdomen and pelvis was performed using the standard protocol following bolus administration of intravenous contrast. CONTRAST:  OMNIPAQUE IOHEXOL 300 MG/ML  SOLN COMPARISON:  None. FINDINGS: Lower chest: Bibasilar atelectasis. Normal size heart. No significant pericardial effusion/thickening. Small hiatal hernia. Hepatobiliary: No suspicious hepatic lesion. Gallbladder is unremarkable. No biliary ductal dilation. Pancreas: Within normal limits. Spleen: Within normal limits. Adrenals/Urinary Tract: Bilateral adrenal glands are unremarkable. No solid enhancing renal masses. Edematous appearance of the left kidney with a few wedge-shaped areas of hypoenhancement of the renal parenchyma. Additionally, there is prominence of the left renal collecting system to the level of 2 stacked stones at the UPJ measuring 2-3 mm with perinephric and peripelvic stranding, however there is contrast visualized below this level on delayed imaging. Punctate nonobstructive right lower pole renal stone. No right-sided hydronephrosis. Urinary bladder is grossly unremarkable for degree of distension. Stomach/Bowel: Small hiatal hernia otherwise the stomach is within normal limits. Appendix appears normal. No evidence of bowel wall thickening,  distention, or inflammatory changes. Vascular/Lymphatic: No significant vascular findings are present. No enlarged abdominal or pelvic lymph nodes. Reproductive: The uterus and adnexa are unremarkable. There are bilateral cystic structures in the anterolateral vaginal wall below the level of the pubic symphysis measuring up to 2.4 cm on the right and 3.4 cm on the left on image 9/4 Other: Fat containing right inguinal hernia. Musculoskeletal: L5-S1 discogenic disease. No acute osseous abnormality. IMPRESSION: 1. Edematous appearance of the LEFT kidney perinephric stranding with a few wedge-shaped areas of hypoenhancement of the renal parenchyma suggesting pyelonephritis. 2. Mild fullness of the LEFT renal collecting system to the level of 2 nonobstructive stacked stones measuring 2-3 mm at the UPJ. 3. Additional punctate bilateral nonobstructive renal stones. 4. Bilateral cystic structures in the anterolateral vaginal wall below the level of the pubic symphysis. Findings may represent urethral diverticulum versus part lung gland cysts. Further evaluation with nonemergent outpatient pelvic ultrasound is suggested. 5. Small hiatal hernia. 6. Fat containing  right inguinal hernia. Electronically Signed   By: Maudry MayhewJeffrey  Waltz MD   On: 12/06/2020 23:24   DG Chest Port 1 View  Result Date: 12/06/2020 CLINICAL DATA:  Fever. EXAM: PORTABLE CHEST 1 VIEW COMPARISON:  01/16/2012 FINDINGS: The cardiomediastinal contours are normal. The lungs are clear. Pulmonary vasculature is normal. No consolidation, pleural effusion, or pneumothorax. No acute osseous abnormalities are seen. IMPRESSION: No acute chest findings. Electronically Signed   By: Narda RutherfordMelanie  Sanford M.D.   On: 12/06/2020 18:58        Scheduled Meds: . enoxaparin (LOVENOX) injection  40 mg Subcutaneous Q24H   Continuous Infusions: . cefTRIAXone (ROCEPHIN)  IV Stopped (12/06/20 1959)     LOS: 0 days    Time spent: 35min  Zannie CovePreetha Shaleah Nissley, MD [facility]  12/07/2020, 10:03 AM

## 2020-12-08 DIAGNOSIS — N898 Other specified noninflammatory disorders of vagina: Secondary | ICD-10-CM | POA: Diagnosis present

## 2020-12-08 DIAGNOSIS — N12 Tubulo-interstitial nephritis, not specified as acute or chronic: Secondary | ICD-10-CM

## 2020-12-08 LAB — URINE CULTURE: Culture: >=100000 — AB

## 2020-12-08 LAB — CBC
HCT: 30.2 % — ABNORMAL LOW (ref 36.0–46.0)
Hemoglobin: 9.4 g/dL — ABNORMAL LOW (ref 12.0–15.0)
MCH: 25.8 pg — ABNORMAL LOW (ref 26.0–34.0)
MCHC: 31.1 g/dL (ref 30.0–36.0)
MCV: 82.7 fL (ref 80.0–100.0)
Platelets: 276 10*3/uL (ref 150–400)
RBC: 3.65 MIL/uL — ABNORMAL LOW (ref 3.87–5.11)
RDW: 14.8 % (ref 11.5–15.5)
WBC: 12.7 10*3/uL — ABNORMAL HIGH (ref 4.0–10.5)
nRBC: 0 % (ref 0.0–0.2)

## 2020-12-08 MED ORDER — CEPHALEXIN 250 MG PO CAPS: 250.0000 mg | ORAL_CAPSULE | Freq: Three times a day (TID) | ORAL | 0 refills | Status: AC

## 2020-12-08 MED ORDER — CEPHALEXIN 250 MG PO CAPS
250.0000 mg | ORAL_CAPSULE | Freq: Four times a day (QID) | ORAL | Status: DC
  Administered 2020-12-08: 250 mg via ORAL
  Filled 2020-12-08 (×2): qty 1

## 2020-12-08 NOTE — Plan of Care (Signed)

## 2020-12-08 NOTE — Discharge Summary (Signed)
Physician Discharge Summary  Julie Guerrero FIE:[id number] DOB: 1975/10/20 DOA: 12/06/2020  PCP: Pcp, No  Admit date: 12/06/2020 Discharge date: 12/08/2020  Time spent: 35 minutes  Recommendations for Outpatient Follow-up:  PCP in 10 days Nonurgent GYN follow-up in 2 months  Discharge Diagnoses:  Principal Problem:   Sepsis due to urinary tract infection (HCC) Active Problems:   Pyelonephritis   Vaginal cysts Borderline diabetes Obesity   Discharge Condition: Stable  Diet recommendation: Carb modified  There were no vitals filed for this visit.  History of present illness:  45 year old female with obesity and prediabetes presented to the ED with fevers and right flank pain, on further evaluation she was noted to be febrile tachycardic with abnormal urinalysis and CT abdomen concerning for Pyelonephritis, no obstructing calculi noted  Hospital Course:   Sepsis  Pyelonephritis -Clinically improved with fluids and antibiotics, urine culture with pansensitive E. coli, transition to oral Keflex and discharged home today  Vaginal cysts -Noted incidentally on CT -Recommend nonurgent GYN follow-up for this  Borderline diabetes mellitus -Hemoglobin A1c is 6.0 -Recommended weight loss and lifestyle modification  Obesity -Recommended weight loss and lifestyle modification  Discharge Exam: Vitals:   12/07/20 2142 12/08/20 0336  BP: 129/90 (!) 141/81  Pulse: 75 67  Resp: 14 14  Temp: 98.1 F (36.7 C) 99.4 F (37.4 C)  SpO2: 100% 100%    General: Pleasant AAOx3, no distress Cardiovascular: S1-S2, regular rate rhythm Respiratory: Clear  Discharge Instructions   Discharge Instructions    Diet Carb Modified   Complete by: As directed      Allergies as of 12/08/2020   No Known Allergies     Medication List    STOP taking these medications   metFORMIN 500 MG tablet Commonly known as: GLUCOPHAGE   Vitamin D (Ergocalciferol) 1.25 MG (50000 UNIT) Caps  capsule Commonly known as: DRISDOL     TAKE these medications   cephALEXin 250 MG capsule Commonly known as: KEFLEX Take 1 capsule (250 mg total) by mouth 3 (three) times daily for 4 days.   ibuprofen 200 MG tablet Commonly known as: ADVIL Take 800 mg by mouth every 6 (six) hours as needed for headache or moderate pain.      No Known Allergies  Follow-up Information    PCP. Schedule an appointment as soon as possible for a visit in 10 day(s).   Why: Also recommend nonurgent gynecology follow-up in 2 months               The results of significant diagnostics from this hospitalization (including imaging, microbiology, ancillary and laboratory) are listed below for reference.    Significant Diagnostic Studies: CT ABDOMEN PELVIS W CONTRAST  Result Date: 12/06/2020 CLINICAL DATA:  Dysuria x1 week with fever and chills EXAM: CT ABDOMEN AND PELVIS WITH CONTRAST TECHNIQUE: Multidetector CT imaging of the abdomen and pelvis was performed using the standard protocol following bolus administration of intravenous contrast. CONTRAST:  OMNIPAQUE IOHEXOL 300 MG/ML  SOLN COMPARISON:  None. FINDINGS: Lower chest: Bibasilar atelectasis. Normal size heart. No significant pericardial effusion/thickening. Small hiatal hernia. Hepatobiliary: No suspicious hepatic lesion. Gallbladder is unremarkable. No biliary ductal dilation. Pancreas: Within normal limits. Spleen: Within normal limits. Adrenals/Urinary Tract: Bilateral adrenal glands are unremarkable. No solid enhancing renal masses. Edematous appearance of the left kidney with a few wedge-shaped areas of hypoenhancement of the renal parenchyma. Additionally, there is prominence of the left renal collecting system to the level of 2 stacked stones at the  UPJ measuring 2-3 mm with perinephric and peripelvic stranding, however there is contrast visualized below this level on delayed imaging. Punctate nonobstructive right lower pole renal stone. No  right-sided hydronephrosis. Urinary bladder is grossly unremarkable for degree of distension. Stomach/Bowel: Small hiatal hernia otherwise the stomach is within normal limits. Appendix appears normal. No evidence of bowel wall thickening, distention, or inflammatory changes. Vascular/Lymphatic: No significant vascular findings are present. No enlarged abdominal or pelvic lymph nodes. Reproductive: The uterus and adnexa are unremarkable. There are bilateral cystic structures in the anterolateral vaginal wall below the level of the pubic symphysis measuring up to 2.4 cm on the right and 3.4 cm on the left on image 9/4 Other: Fat containing right inguinal hernia. Musculoskeletal: L5-S1 discogenic disease. No acute osseous abnormality. IMPRESSION: 1. Edematous appearance of the LEFT kidney perinephric stranding with a few wedge-shaped areas of hypoenhancement of the renal parenchyma suggesting pyelonephritis. 2. Mild fullness of the LEFT renal collecting system to the level of 2 nonobstructive stacked stones measuring 2-3 mm at the UPJ. 3. Additional punctate bilateral nonobstructive renal stones. 4. Bilateral cystic structures in the anterolateral vaginal wall below the level of the pubic symphysis. Findings may represent urethral diverticulum versus part lung gland cysts. Further evaluation with nonemergent outpatient pelvic ultrasound is suggested. 5. Small hiatal hernia. 6. Fat containing right inguinal hernia. Electronically Signed   By: Maudry Mayhew MD   On: 12/06/2020 23:24   DG Chest Port 1 View  Result Date: 12/06/2020 CLINICAL DATA:  Fever. EXAM: PORTABLE CHEST 1 VIEW COMPARISON:  01/16/2012 FINDINGS: The cardiomediastinal contours are normal. The lungs are clear. Pulmonary vasculature is normal. No consolidation, pleural effusion, or pneumothorax. No acute osseous abnormalities are seen. IMPRESSION: No acute chest findings. Electronically Signed   By: Narda Rutherford M.D.   On: 12/06/2020 18:58     Microbiology: Recent Results (from the past 240 hour(s))  Resp Panel by RT-PCR (Flu A&B, Covid) Nasopharyngeal Swab     Status: None   Collection Time: 12/06/20 12:34 PM   Specimen: Nasopharyngeal Swab; Nasopharyngeal(NP) swabs in vial transport medium  Result Value Ref Range Status   SARS Coronavirus 2 by RT PCR NEGATIVE NEGATIVE Final    Comment: (NOTE) SARS-CoV-2 target nucleic acids are NOT DETECTED.  The SARS-CoV-2 RNA is generally detectable in upper respiratory specimens during the acute phase of infection. The lowest concentration of SARS-CoV-2 viral copies this assay can detect is 138 copies/mL. A negative result does not preclude SARS-Cov-2 infection and should not be used as the sole basis for treatment or other patient management decisions. A negative result may occur with  improper specimen collection/handling, submission of specimen other than nasopharyngeal swab, presence of viral mutation(s) within the areas targeted by this assay, and inadequate number of viral copies(<138 copies/mL). A negative result must be combined with clinical observations, patient history, and epidemiological information. The expected result is Negative.  Fact Sheet for Patients:  BloggerCourse.com  Fact Sheet for Healthcare Providers:  SeriousBroker.it  This test is no t yet approved or cleared by the [place] FDA and  has been authorized for detection and/or diagnosis of SARS-CoV-2 by FDA under an Emergency Use Authorization (EUA). This EUA will remain  in effect (meaning this test can be used) for the duration of the COVID-19 declaration under Section 564(b)(1) of the Act, 21 U.S.C.section 360bbb-3(b)(1), unless the authorization is terminated  or revoked sooner.       Influenza A by PCR NEGATIVE NEGATIVE Final   Influenza  B by PCR NEGATIVE NEGATIVE Final    Comment: (NOTE) The Xpert Xpress SARS-CoV-2/FLU/RSV plus assay is  intended as an aid in the diagnosis of influenza from Nasopharyngeal swab specimens and should not be used as a sole basis for treatment. Nasal washings and aspirates are unacceptable for Xpert Xpress SARS-CoV-2/FLU/RSV testing.  Fact Sheet for Patients: BloggerCourse.com  Fact Sheet for Healthcare Providers: SeriousBroker.it  This test is not yet approved or cleared by the [place] FDA and has been authorized for detection and/or diagnosis of SARS-CoV-2 by FDA under an Emergency Use Authorization (EUA). This EUA will remain in effect (meaning this test can be used) for the duration of the COVID-19 declaration under Section 564(b)(1) of the Act, 21 U.S.C. section 360bbb-3(b)(1), unless the authorization is terminated or revoked.  Performed at [facility], [address] [place], [place] [address]   Blood Culture (routine x 2)     Status: None (Preliminary result)   Collection Time: 12/06/20  6:13 PM   Specimen: Site Not Specified; Blood  Result Value Ref Range Status   Specimen Description SITE NOT SPECIFIED  Final   Special Requests   Final    BOTTLES DRAWN AEROBIC AND ANAEROBIC Blood Culture results may not be optimal due to an inadequate volume of blood received in culture bottles   Culture   Final    NO GROWTH 2 DAYS Performed at [facility], [address], [place], [place] [address]    Report Status PENDING  Incomplete  Blood Culture (routine x 2)     Status: None (Preliminary result)   Collection Time: 12/06/20  6:18 PM   Specimen: Site Not Specified; Blood  Result Value Ref Range Status   Specimen Description SITE NOT SPECIFIED  Final   Special Requests   Final    BOTTLES DRAWN AEROBIC AND ANAEROBIC Blood Culture results may not be optimal due to an inadequate volume of blood received in culture bottles   Culture   Final    NO GROWTH 2 DAYS Performed at [facility], [address]  [place], [place] [address]    Report Status PENDING  Incomplete  Urine culture     Status: Abnormal   Collection Time: 12/06/20  6:45 PM   Specimen: In/Out Cath Urine  Result Value Ref Range Status   Specimen Description IN/OUT CATH URINE  Final   Special Requests   Final    NONE Performed at [facility], [address], [place], [place] [address]    Culture >=100,000 COLONIES/mL ESCHERICHIA COLI (A)  Final   Report Status 12/08/2020 FINAL  Final   Organism ID, Bacteria ESCHERICHIA COLI (A)  Final      Susceptibility   Escherichia coli - MIC*    AMPICILLIN 4 SENSITIVE Sensitive     CEFAZOLIN <=4 SENSITIVE Sensitive     CEFEPIME <=0.12 SENSITIVE Sensitive     CEFTRIAXONE <=0.25 SENSITIVE Sensitive     CIPROFLOXACIN <=0.25 SENSITIVE Sensitive     GENTAMICIN <=1 SENSITIVE Sensitive     IMIPENEM <=0.25 SENSITIVE Sensitive     NITROFURANTOIN <=16 SENSITIVE Sensitive     TRIMETH/SULFA <=20 SENSITIVE Sensitive     AMPICILLIN/SULBACTAM <=2 SENSITIVE Sensitive     PIP/TAZO <=4 SENSITIVE Sensitive     * >=100,000 COLONIES/mL ESCHERICHIA COLI     Labs: Basic Metabolic Panel: Recent Labs  Lab 12/06/20 1256 12/07/20 0204  NA 132* 133*  K 3.5 3.2*  CL 98 99  CO2 23 23  GLUCOSE 122* 112*  BUN 7 5*  CREATININE 0.81 0.78  CALCIUM 8.9 8.4*   Liver Function Tests: Recent Labs  Lab 12/06/20 1256  AST 16  ALT 14  ALKPHOS 41  BILITOT 0.5  PROT 6.8  ALBUMIN 3.4*   No results for input(s): LIPASE, AMYLASE in the last 168 hours. No results for input(s): AMMONIA in the last 168 hours. CBC: Recent Labs  Lab 12/06/20 1256 12/07/20 0204 12/08/20 0019  WBC 21.0* 17.9* 12.7*  NEUTROABS 16.3*  --   --   HGB 10.7* 9.6* 9.4*  HCT 35.5* 31.0* 30.2*  MCV 84.1 83.1 82.7  PLT 324 277 276   Cardiac Enzymes: No results for input(s): CKTOTAL, CKMB, CKMBINDEX, TROPONINI in the last 168 hours. BNP: BNP (last 3 results) No results for input(s): BNP in the last 8760 hours.  ProBNP  (last 3 results) No results for input(s): PROBNP in the last 8760 hours.  CBG: No results for input(s): GLUCAP in the last 168 hours.     Signed:  Zannie CovePreetha Gurinder Toral MD.  [facility] 12/08/2020, 11:03 AM

## 2020-12-11 LAB — CULTURE, BLOOD (ROUTINE X 2)
Culture: NO GROWTH
Culture: NO GROWTH

## 2021-05-16 ENCOUNTER — Encounter (HOSPITAL_COMMUNITY): Payer: Self-pay

## 2021-05-16 ENCOUNTER — Emergency Department (HOSPITAL_COMMUNITY)
Admission: EM | Admit: 2021-05-16 | Discharge: 2021-05-16 | Disposition: A | Discharge status: Home / Self Care | Payer: Self-pay | Attending: Emergency Medicine | Admitting: Emergency Medicine

## 2021-05-16 ENCOUNTER — Other Ambulatory Visit: Payer: Self-pay

## 2021-05-16 DIAGNOSIS — N751 Abscess of Bartholin's gland: Secondary | ICD-10-CM | POA: Insufficient documentation

## 2021-05-16 DIAGNOSIS — F17210 Nicotine dependence, cigarettes, uncomplicated: Secondary | ICD-10-CM | POA: Insufficient documentation

## 2021-05-16 MED ORDER — LIDOCAINE HCL (PF) 1 % IJ SOLN
INTRAMUSCULAR | Status: AC
  Filled 2021-05-16: qty 10

## 2021-05-16 MED ORDER — OXYCODONE-ACETAMINOPHEN 5-325 MG PO TABS
1.0000 | ORAL_TABLET | Freq: Once | ORAL | Status: AC
  Administered 2021-05-16: 1 via ORAL
  Filled 2021-05-16: qty 1

## 2021-05-16 MED ORDER — LIDOCAINE HCL (PF) 1 % IJ SOLN
10.0000 mL | Freq: Once | INTRAMUSCULAR | Status: DC
  Filled 2021-05-16 (×2): qty 10

## 2021-05-16 NOTE — Discharge Instructions (Addendum)
You came to the emergency department today to be evaluated for your vaginal abscess.  Due to your abscess and incision and drainage was performed.  Please read attached paperwork incision and drainage follow-up care.  You will need to apply warm compress to the area at least 3 times daily.  Please follow-up with provider or OB/GYN provider in 3 days for wound recheck.    Contact a health care provider if: You have chills or a fever. Medicine is not controlling your pain. You have signs of infection.

## 2021-05-16 NOTE — ED Provider Notes (Signed)
Emergency Medicine Provider Triage Evaluation Note  Julie Guerrero , a 45 y.o. female  was evaluated in triage.  Pt complains of vaginal abscess.  Patient noted abscess 3 days ago.  Abscess has grown in size over that time.  Patient has had no relief with warm compresses.  No purulent discharge.  Review of Systems  Positive: Vaginal abscess Negative: Vaginal bleeding, vaginal discharge, dysuria, hematuria, urinary frequency, pelvic pain  Physical Exam  BP (!) 154/92 (BP Location: Right Arm)   Pulse 96   Temp 98.2 F (36.8 C) (Oral)   Resp 20   Ht 5\' 7"  (1.702 m)   Wt 113.4 kg   LMP 05/04/2021 (Approximate)   SpO2 100%   BMI 39.16 kg/m  Gen:   Awake, no distress   Resp:  Normal effort  MSK:   Moves extremities without difficulty  Other:    Medical Decision Making  Medically screening exam initiated at 10:57 AM.  Appropriate orders placed.  RHETA HEMMELGARN was informed that the remainder of the evaluation will be completed by another provider, this initial triage assessment does not replace that evaluation, and the importance of remaining in the ED until their evaluation is complete.  The patient appears stable so that the remainder of the work up may be completed by another provider.      Glenetta Borg, PA-C 05/16/21 1058    07/16/21, MD 05/16/21 [phone]

## 2021-05-16 NOTE — ED Notes (Signed)
RN reviewed discharge instructions w/ pt. Follow up reviewed, pt had no further questions 

## 2021-05-16 NOTE — ED Provider Notes (Signed)
[facility] EMERGENCY DEPARTMENT Provider Note   CSN: [id number] Arrival date & time: 05/16/21  1021     History Chief Complaint  Patient presents with   Vaginal Pain    Julie Guerrero is a 45 y.o. female presents to the emergency department with a chief complaint of vaginal abscess.  Patient reports that she started having some irritation to right vaginal wall on Friday after finishing her menstrual period.  She started developing swelling and pain to right vaginal wall on Saturday.  Swelling and pain have gotten progressively worse over time.  Patient rates pain 8/10 on the pain scale.  Pain is worse with touch, standing, sitting, movement.  Patient has had no relief with warm compresses, cold compresses, or Preparation H.  Patient denies any purulent discharge.  Denies any fevers, chills, nausea, vomiting, abdominal pain, dysuria, hematuria, urinary frequency, urinary urgency, vaginal discharge, vaginal bleeding, genital sores or lesions.   Vaginal Pain Pertinent negatives include no chest pain, no abdominal pain, no headaches and no shortness of breath.      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   Hernia, inguinal, right    Eczema 01/15/2019   Chronic constipation 01/15/2019   Preop examination  01/15/2019   Right inguinal hernia 01/15/2019    Past Surgical History:  Procedure Laterality Date   TUBAL LIGATION       OB History     Gravida  3   Para  3   Term      Preterm      AB      Living         SAB      IAB      Ectopic      Multiple      Live Births              Family History  Problem Relation Age of Onset   Hypertension Mother    Asthma Mother    Diabetes Mother    High Cholesterol Mother    High blood pressure Mother    Obesity Mother    Thyroid disease Maternal Grandmother    Diabetes Maternal Grandmother    Other Father        unknown   Diabetes Father    Obesity Father    Heart disease Neg Hx    Stroke Neg Hx     Social History   Tobacco Use   Smoking status: Light Smoker    Packs/day: 0.10    Years: 6.00    Pack years: 0.60    Types: Cigarettes   Smokeless tobacco: Never  Vaping Use   Vaping Use: Never used  Substance Use Topics   Alcohol use: Yes    Comment: occ  Drug use: No    Home Medications Prior to Admission medications   Medication Sig Start Date End Date Taking? Authorizing Provider  ibuprofen (ADVIL) 200 MG tablet Take 800 mg by mouth every 6 (six) hours as needed for headache or moderate pain.    [provider]    Allergies    Patient has no known allergies.  Review of Systems   Review of Systems  Constitutional:  Negative for chills and fever.  Eyes:  Negative for visual disturbance.  Respiratory:  Negative for shortness of breath.   Cardiovascular:  Negative for chest pain.  Gastrointestinal:  Negative for abdominal pain, nausea and vomiting.  Genitourinary:  Positive for vaginal pain. Negative for decreased urine volume, difficulty urinating, dysuria, flank pain, frequency, hematuria, pelvic pain, urgency, vaginal bleeding and vaginal discharge.  Musculoskeletal:  Negative for back pain and neck pain.  Skin:  Negative for color change and rash.  Neurological:  Negative for  dizziness, syncope, light-headedness and headaches.  Psychiatric/Behavioral:  Negative for confusion.    Physical Exam Updated Vital Signs BP (!) 154/92 (BP Location: Right Arm)   Pulse 96   Temp 98.2 F (36.8 C) (Oral)   Resp 20   Ht 5\' 7"  (1.702 m)   Wt 113.4 kg   LMP 05/04/2021 (Approximate)   SpO2 100%   BMI 39.16 kg/m   Physical Exam Vitals and nursing note reviewed. Exam conducted with a chaperone present (Female RN present as chaperone).  Constitutional:      General: She is not in acute distress.    Appearance: She is not ill-appearing, toxic-appearing or diaphoretic.  HENT:     Head: Normocephalic.  Eyes:     General: No scleral icterus.       Right eye: No discharge.        Left eye: No discharge.  Cardiovascular:     Rate and Rhythm: Normal rate.  Pulmonary:     Effort: Pulmonary effort is normal.  Abdominal:     Hernia: A hernia is present. Hernia is present in the right inguinal area. There is no hernia in the left inguinal area.  Genitourinary:    Exam position: Lithotomy position.     Pubic Area: No rash or pubic lice.      Tanner stage (genital): 5.     Labia:        Right: Tenderness present. No rash, lesion or injury.        Left: No rash, tenderness, lesion or injury.      Comments: Patient has tenderness and area of fluctuance to right labia.  No purulent discharge, rash, erythema, vaginal discharge noted.  Patient right inguinal hernia is baseline for her.  No pain or change to inguinal hernia. Lymphadenopathy:     Lower Body: No left inguinal adenopathy.  Skin:    General: Skin is warm and dry.  Neurological:     General: No focal deficit present.     Mental Status: She is alert.     GCS: GCS eye subscore is 4. GCS verbal subscore is 5. GCS motor subscore is 6.  Psychiatric:        Behavior: Behavior is cooperative.    ED Results / Procedures / Treatments   Labs (all labs ordered are listed, but only abnormal results are displayed) Labs  Reviewed - No data to display  EKG None  Radiology No results found.  Procedures .09/25/2022Incision and Drainage  Date/Time: 05/16/2021 5:29 PM Performed by: 07/16/2021  R, PA-C Authorized by: Haskel Schroeder, PA-C   Consent:    Consent obtained:  Verbal   Consent given by:  Patient   Risks, benefits, and alternatives were discussed: yes     Risks discussed:  Bleeding, incomplete drainage, pain and damage to other organs   Alternatives discussed:  No treatment and delayed treatment Universal protocol:    Procedure explained and questions answered to patient or proxy's satisfaction: yes     Patient identity confirmed:  Verbally with patient and arm band Location:    Type:  Bartholin cyst   Size:  1cm   Location:  Anogenital   Anogenital location:  Bartholin's gland Anesthesia:    Anesthesia method:  Local infiltration   Local anesthetic:  Lidocaine 1% w/o epi Procedure type:    Complexity:  Simple Procedure details:    Incision types:  Cruciate   Incision depth:  Subcutaneous   Wound management:  Probed and deloculated, irrigated with saline and extensive cleaning   Drainage:  Purulent   Drainage amount:  Copious   Wound treatment:  Wound left open Post-procedure details:    Procedure completion:  Tolerated well, no immediate complications   Medications Ordered in ED Medications  oxyCODONE-acetaminophen (PERCOCET/ROXICET) 5-325 MG per tablet 1 tablet (1 tablet Oral Given 05/16/21 1401)  lidocaine (PF) (XYLOCAINE) 1 % injection ( Intradermal Given 05/16/21 1457)    ED Course  I have reviewed the triage vital signs and the nursing notes.  Pertinent labs & imaging results that were available during my care of the patient were reviewed by me and considered in my medical decision making (see chart for details).    MDM Rules/Calculators/A&P                           Alert 45 year old female no acute distress, nontoxic-appearing.  Presents to ED with chief  complaint of vaginal abscess.  Patient denies any systemic symptoms.  Physical exam consistent with Bartholin abscess.  Discussed incision with drainage with patient who elects for I&D procedure at this time.  Physical exam and I&D procedure were performed with female RN at bedside.  I&D procedure as noted above.  Patient to follow-up with primary care provider and OB/GYN provider.  Discussed results, findings, treatment and follow up. Patient advised of return precautions. Patient verbalized understanding and agreed with plan.   Final Clinical Impression(s) / ED Diagnoses Final diagnoses:  Bartholin's gland abscess    Rx / DC Orders ED Discharge Orders     None        Berneice Heinrich 05/16/21 1910    Wynetta Fines, MD 05/16/21 2306

## 2021-05-16 NOTE — ED Triage Notes (Signed)
Pt reports possible vaginal abscess in the inner area of her vaginal wall, started about 3 days ago. No discharge noted, pt just came off her cycle

## 2021-08-07 IMAGING — CT CT ABD-PELV W/ CM
2 of 6 series · 16 of 46 positions shown, 18 images · IV contrast (omnipaque)
Comparison: None.

CLINICAL DATA: Dysuria x1 week with fever and chills

EXAM:
CT ABDOMEN AND PELVIS WITH CONTRAST
TECHNIQUE: Multidetector CT imaging of the abdomen and pelvis was performed
using the standard protocol following bolus administration of
intravenous contrast.
CONTRAST:  100mL OMNIPAQUE IOHEXOL 300 MG/ML  SOLN

[Series 3: abd/ pelvis 5.0 i30f 2 · axial · 0.88mm/px · z∈[+902,+1287]mm · 13 of 89 slices shown, 15 images]
[im 6/89  soft-tissue]
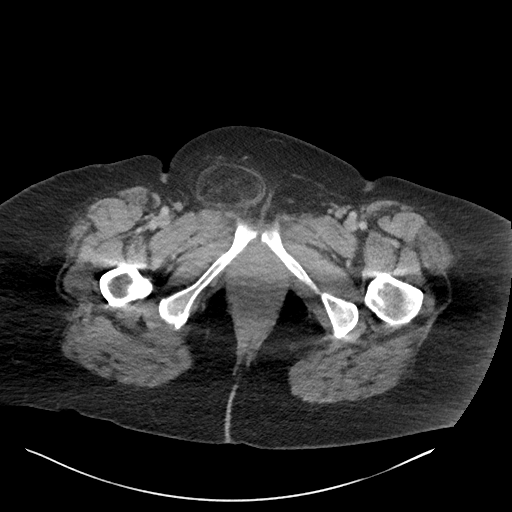
[im 6/89  bone]
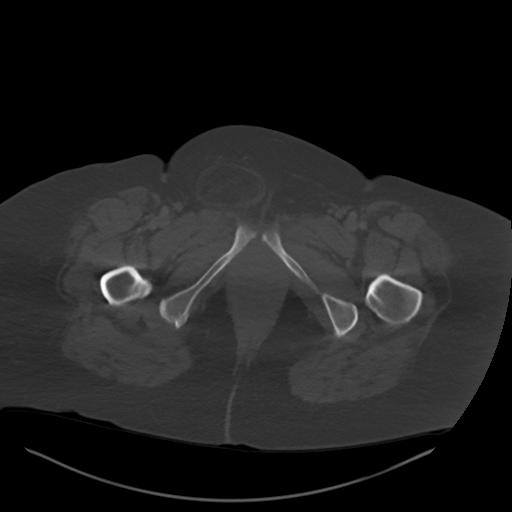
[im 11/89  soft-tissue]
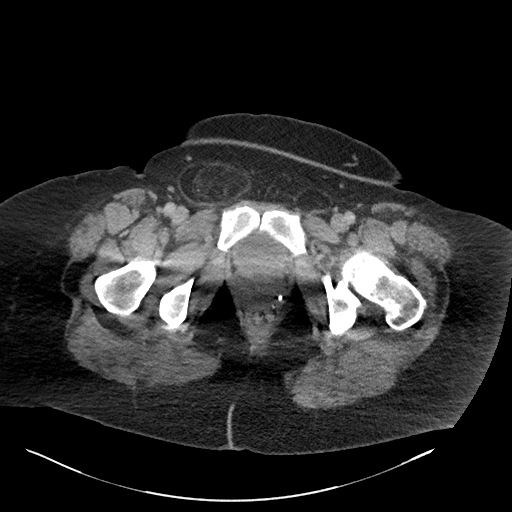
[im 21/89  soft-tissue]
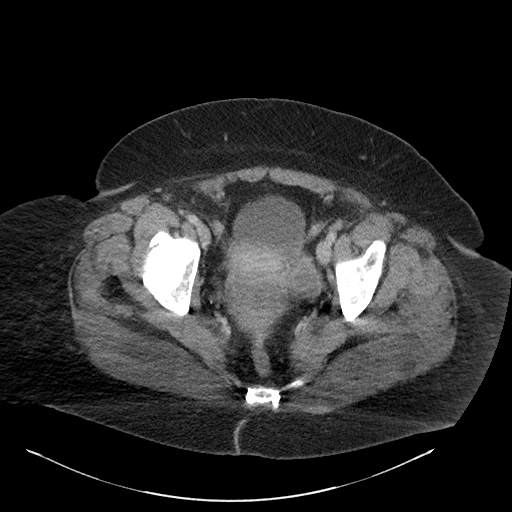
[im 26/89  soft-tissue]
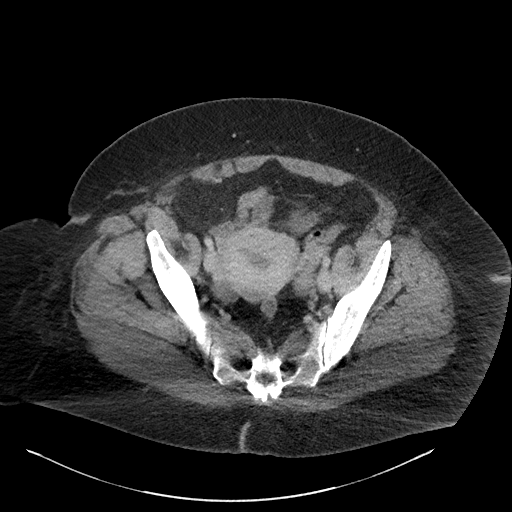
[im 32/89  soft-tissue]
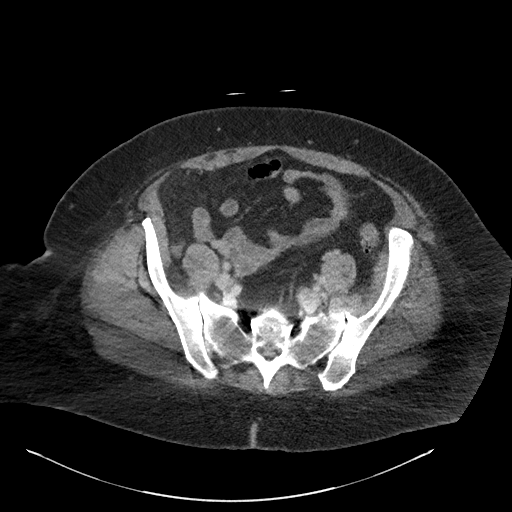
[im 37/89  soft-tissue]
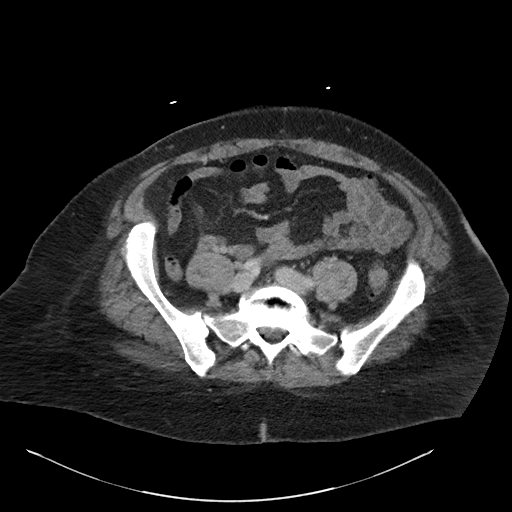
[im 47/89  soft-tissue]
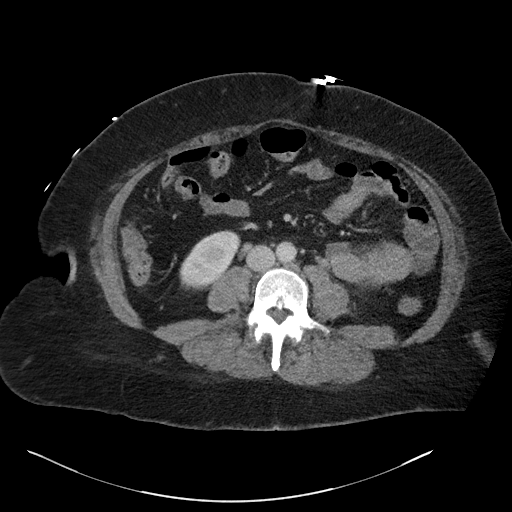
[im 52/89  soft-tissue]
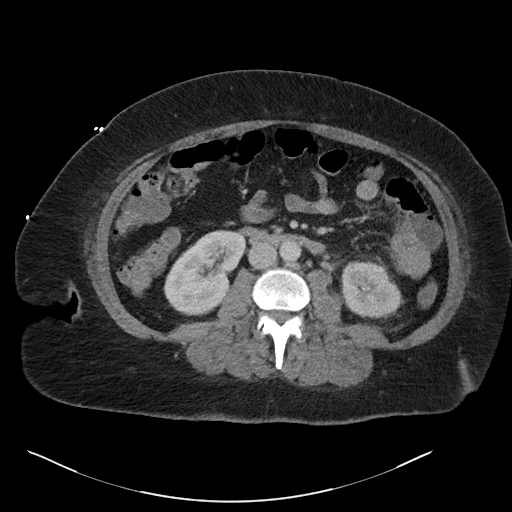
[im 57/89  soft-tissue]
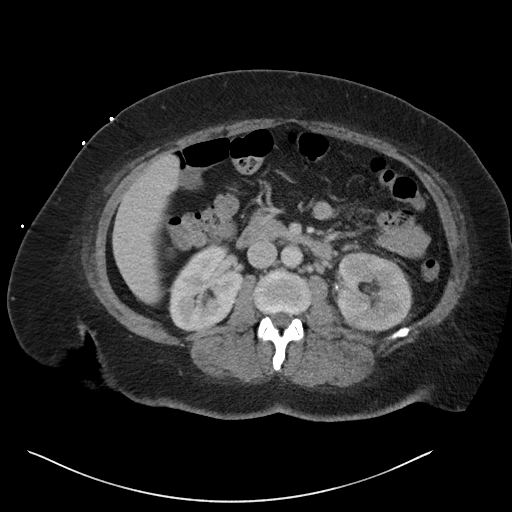
[im 57/89  bone]
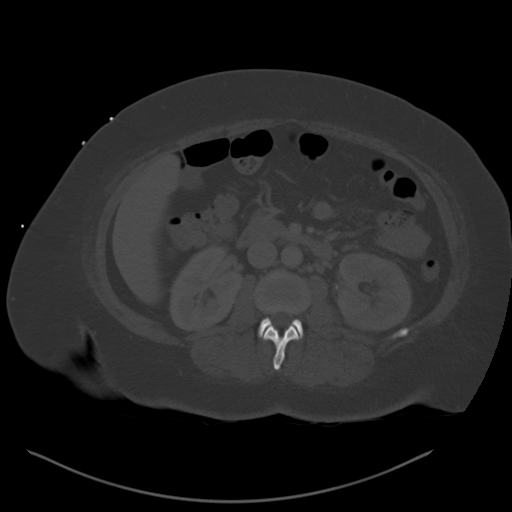
[im 63/89  soft-tissue]
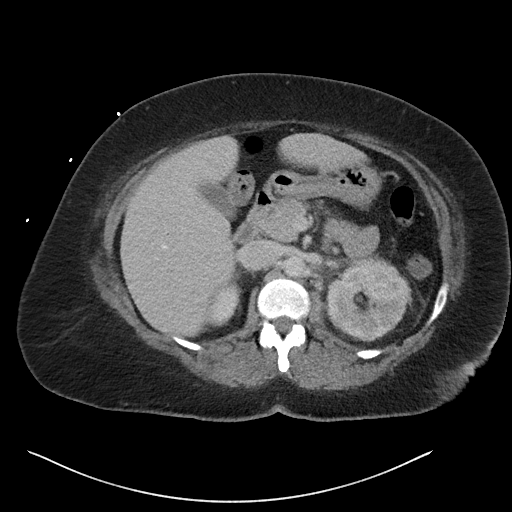
[im 68/89  soft-tissue]
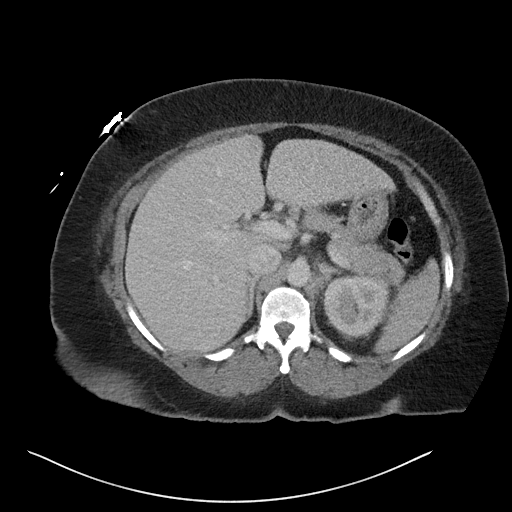
[im 78/89  soft-tissue]
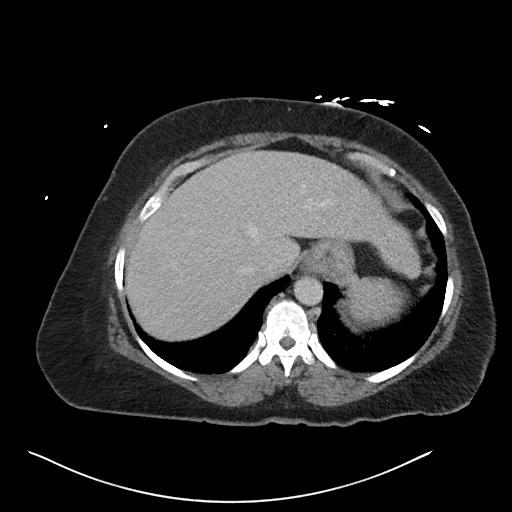
[im 83/89  soft-tissue]
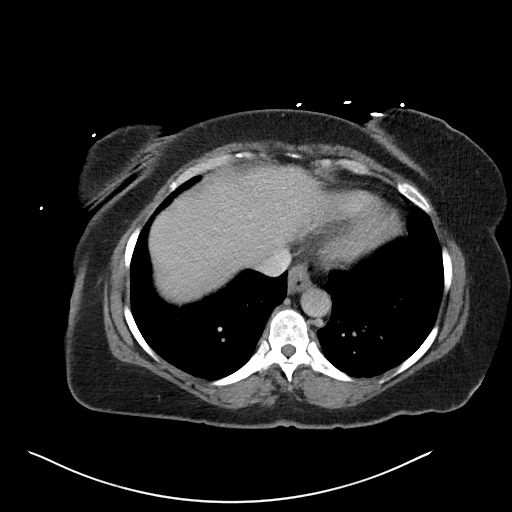

[Series 5: coronal soft tissue · coronal · 0.91mm/px · 3 of 105 slices shown]
[im 35/105  soft-tissue]
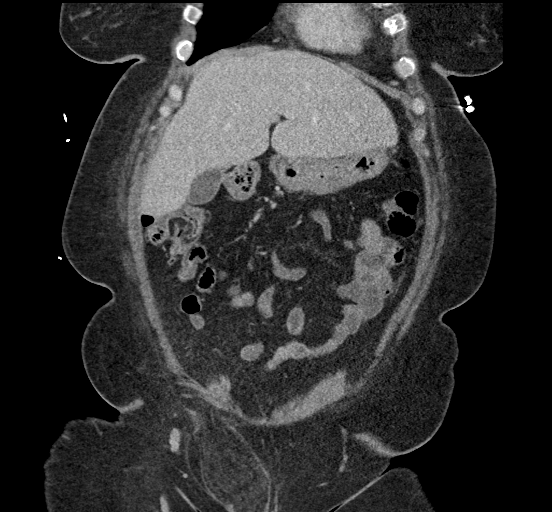
[im 47/105  soft-tissue]
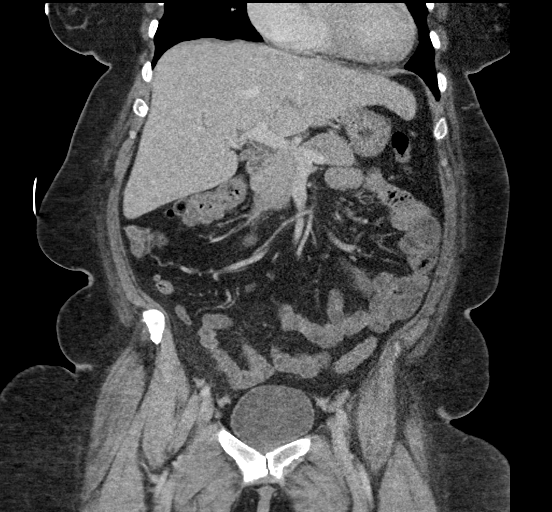
[im 58/105  soft-tissue]
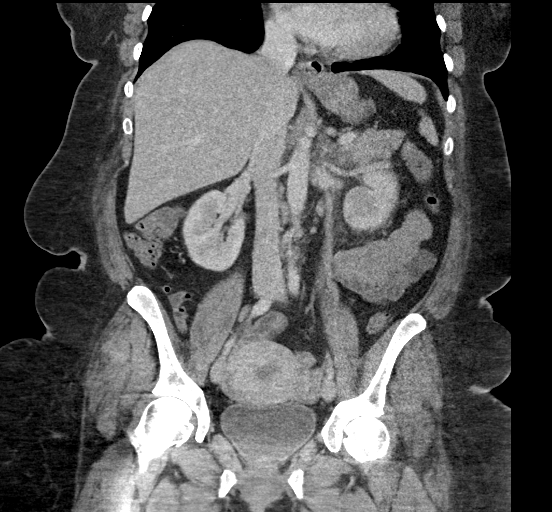

[16 of 46 positions shown; findings below may reference images not displayed]

FINDINGS: Lower chest: Bibasilar atelectasis. Normal size heart. No
significant pericardial effusion/thickening. Small hiatal hernia.

Hepatobiliary: No suspicious hepatic lesion. Gallbladder is
unremarkable. No biliary ductal dilation.

Pancreas: Within normal limits.

Spleen: Within normal limits.

Adrenals/Urinary Tract: Bilateral adrenal glands are unremarkable.

No solid enhancing renal masses. Edematous appearance of the left
kidney with a few wedge-shaped areas of hypoenhancement of the renal
parenchyma. Additionally, there is prominence of the left renal
collecting system to the level of 2 stacked stones at the UPJ
measuring 2-3 mm with perinephric and peripelvic stranding, however
there is contrast visualized below this level on delayed imaging.
Punctate nonobstructive right lower pole renal stone. No right-sided
hydronephrosis.

Urinary bladder is grossly unremarkable for degree of distension.

Stomach/Bowel: Small hiatal hernia otherwise the stomach is within
normal limits. Appendix appears normal. No evidence of bowel wall
thickening, distention, or inflammatory changes.

Vascular/Lymphatic: No significant vascular findings are present. No
enlarged abdominal or pelvic lymph nodes.

Reproductive: The uterus and adnexa are unremarkable. There are
bilateral cystic structures in the anterolateral vaginal wall below
the level of the pubic symphysis measuring up to 2.4 cm on the right
and 3.4 cm on the left on image [DATE]

Other: Fat containing right inguinal hernia.

Musculoskeletal: L5-S1 discogenic disease. No acute osseous
abnormality.
IMPRESSION: 1. Edematous appearance of the LEFT kidney perinephric stranding
with a few wedge-shaped areas of hypoenhancement of the renal
parenchyma suggesting pyelonephritis.
2. Mild fullness of the LEFT renal collecting system to the level of
2 nonobstructive stacked stones measuring 2-3 mm at the UPJ.
3. Additional punctate bilateral nonobstructive renal stones.
4. Bilateral cystic structures in the anterolateral vaginal wall
below the level of the pubic symphysis. Findings may represent
urethral diverticulum versus part lung gland cysts. Further
evaluation with nonemergent outpatient pelvic ultrasound is
suggested.
5. Small hiatal hernia.
6. Fat containing right inguinal hernia.

## 2022-04-11 ENCOUNTER — Telehealth: Payer: Self-pay | Admitting: Physician Assistant

## 2022-04-11 NOTE — Telephone Encounter (Signed)
Scheduled appt per 8/31 referral. Pt is aware of appt date and time. Pt is aware to arrive 15 mins prior to appt time and to bring and updated insurance card. Pt is aware of appt location.   

## 2022-05-03 ENCOUNTER — Telehealth: Payer: Self-pay | Admitting: Physician Assistant

## 2022-05-03 NOTE — Telephone Encounter (Signed)
R/s pt's new hem appt per pt request. Pt is aware of new appt date/time.  

## 2022-05-07 ENCOUNTER — Other Ambulatory Visit: Payer: Self-pay

## 2022-05-07 ENCOUNTER — Encounter (HOSPITAL_COMMUNITY): Payer: Self-pay | Admitting: Emergency Medicine

## 2022-05-07 ENCOUNTER — Inpatient Hospital Stay: Payer: Commercial Managed Care - PPO | Admitting: Physician Assistant

## 2022-05-07 ENCOUNTER — Inpatient Hospital Stay: Payer: Commercial Managed Care - PPO

## 2022-05-07 ENCOUNTER — Emergency Department (HOSPITAL_COMMUNITY)
Admission: EM | Admit: 2022-05-07 | Discharge: 2022-05-07 | Discharge status: Against Medical Advice | Payer: Commercial Managed Care - PPO | Attending: Emergency Medicine | Admitting: Emergency Medicine

## 2022-05-07 DIAGNOSIS — N764 Abscess of vulva: Secondary | ICD-10-CM | POA: Diagnosis not present

## 2022-05-07 DIAGNOSIS — Z5321 Procedure and treatment not carried out due to patient leaving prior to being seen by health care provider: Secondary | ICD-10-CM | POA: Insufficient documentation

## 2022-05-07 MED ORDER — IBUPROFEN 400 MG PO TABS
400.0000 mg | ORAL_TABLET | Freq: Once | ORAL | Status: DC | PRN
  Filled 2022-05-07: qty 1

## 2022-05-07 NOTE — ED Triage Notes (Signed)
Pt reports recurrent abscess to right side of vagina; reports has required draining in the past, no relief with warm compresses

## 2022-05-07 NOTE — ED Notes (Signed)
Called and no answer for reassessment

## 2022-05-07 NOTE — ED Notes (Signed)
Called and no answer for reassessment 

## 2022-05-08 ENCOUNTER — Emergency Department (HOSPITAL_COMMUNITY)
Admission: EM | Admit: 2022-05-08 | Discharge: 2022-05-08 | Disposition: A | Discharge status: Home / Self Care | Payer: Commercial Managed Care - PPO | Attending: Emergency Medicine | Admitting: Emergency Medicine

## 2022-05-08 ENCOUNTER — Other Ambulatory Visit: Payer: Self-pay

## 2022-05-08 DIAGNOSIS — N751 Abscess of Bartholin's gland: Secondary | ICD-10-CM | POA: Insufficient documentation

## 2022-05-08 MED ORDER — OXYCODONE HCL 5 MG PO TABS
5.0000 mg | ORAL_TABLET | Freq: Once | ORAL | Status: AC
  Administered 2022-05-08: 5 mg via ORAL
  Filled 2022-05-08: qty 1

## 2022-05-08 MED ORDER — IBUPROFEN 200 MG PO TABS
600.0000 mg | ORAL_TABLET | Freq: Once | ORAL | Status: AC
  Administered 2022-05-08: 600 mg via ORAL
  Filled 2022-05-08: qty 3

## 2022-05-08 MED ORDER — LIDOCAINE-EPINEPHRINE (PF) 2 %-1:200000 IJ SOLN
10.0000 mL | Freq: Once | INTRAMUSCULAR | Status: AC
  Administered 2022-05-08: 10 mL
  Filled 2022-05-08: qty 20

## 2022-05-08 MED ORDER — ACETAMINOPHEN 500 MG PO TABS
1000.0000 mg | ORAL_TABLET | Freq: Four times a day (QID) | ORAL | Status: DC | PRN
  Administered 2022-05-08: 1000 mg via ORAL
  Filled 2022-05-08: qty 2

## 2022-05-08 NOTE — ED Provider Triage Note (Signed)
Emergency Medicine Provider Triage Evaluation Note  Julie Guerrero , a 47 y.o. female  was evaluated in triage.  Pt complains of cyst. Patient believes it is a bartholin cyst. Noticed it two days ago. Started feeling vaginal discomfort. Bulding labia on the right side. Some pus discharge in the beginning but has subsided. Had one once before about 2 years ago. No fever, nausea, vomiting, diarrhea. Cyst is very painful. Taking ibuprofen for pain.   Review of Systems  Positive: See above Negative: See above  Physical Exam  BP 109/66 (BP Location: Left Arm)   Pulse 81   Temp 98.3 F (36.8 C) (Oral)   Resp 16   LMP 04/14/2022 (Exact Date)   SpO2 100%  Gen:   Awake, no distress   Resp:  Normal effort  MSK:   Moves extremities without difficulty  Other:  Patient deferred vaginal exam until going to a room  Medical Decision Making  Medically screening exam initiated at 1:11 PM.  Appropriate orders placed.  Julie Guerrero was informed that the remainder of the evaluation will be completed by another provider, this initial triage assessment does not replace that evaluation, and the importance of remaining in the ED until their evaluation is complete.  Tylenol   Julie Pho, PA-C 05/08/22 1315

## 2022-05-08 NOTE — ED Triage Notes (Signed)
Pt reports recurrent abscess to right side of vagina with yellow drainage.

## 2022-05-08 NOTE — ED Provider Notes (Signed)
[facility]-EMERGENCY DEPT Provider Note   CSN: [id number] Arrival date & time: 05/08/22  1235     History  Chief Complaint  Patient presents with   Cyst    Julie Guerrero is a 46 y.o. female.  Patient is a 46 year old female with no significant past medical history presenting to the emergency department with concern for Bartholin's gland abscess.  She states she has had a Bartholin's gland abscess once before and this feels similar.  She states that over the last 3 days she has had increasing swelling and pain.  She states that she noticed a small amount of purulent discharge on day 1 but has since started her period and is unsure if it is still draining.  She denies any fevers or chills, nausea, vomiting or abdominal pain.  The history is provided by the patient.       Home Medications Prior to Admission medications   Medication Sig Start Date End Date Taking? Authorizing Provider  ibuprofen (ADVIL) 200 MG tablet Take 800 mg by mouth every 6 (six) hours as needed for headache or moderate pain.    [provider]      Allergies    Patient has no known allergies.    Review of Systems   Review of Systems  Physical Exam Updated Vital Signs BP 109/72   Pulse 72   Temp 98.3 F (36.8 C) (Oral)   Resp 16   LMP 04/14/2022 (Exact Date)   SpO2 100%  Physical Exam Vitals and nursing note reviewed. Exam conducted with a chaperone present (female tech).  Constitutional:      General: She is not in acute distress.    Appearance: Normal appearance.  HENT:     Head: Normocephalic and atraumatic.     Mouth/Throat:     Mouth: Mucous membranes are moist.     Pharynx: Oropharynx is clear.  Eyes:     Extraocular Movements: Extraocular movements intact.     Conjunctiva/sclera: Conjunctivae normal.  Cardiovascular:     Rate and Rhythm: Normal rate and regular rhythm.     Pulses: Normal pulses.  Pulmonary:     Effort: Pulmonary effort is normal.      Breath sounds: Normal breath sounds.  Abdominal:     General: Abdomen is flat.     Palpations: Abdomen is soft.     Tenderness: There is no abdominal tenderness.  Genitourinary:    Comments: Approximately 2 cm area of fluctuance on right sided labia with no active drainage but no surrounding erythema or warmth Musculoskeletal:        General: Normal range of motion.     Cervical back: Normal range of motion and neck supple.  Skin:    General: Skin is warm and dry.  Neurological:     General: No focal deficit present.     Mental Status: She is alert and oriented to person, place, and time.  Psychiatric:        Mood and Affect: Mood normal.        Behavior: Behavior normal.    ED Results / Procedures / Treatments   Labs (all labs ordered are listed, but only abnormal results are displayed) Labs Reviewed - No data to display  EKG None  Radiology No results found.  Procedures .Marland KitchenIncision and Drainage  Date/Time: 05/08/2022 7:24 PM  Performed by: Rexford Maus, DO Authorized by: Rexford Maus, DO   Consent:    Consent obtained:  Verbal  Consent given by:  Patient   Risks, benefits, and alternatives were discussed: yes     Risks discussed:  Bleeding, incomplete drainage, pain and infection   Alternatives discussed:  No treatment and delayed treatment Universal protocol:    Procedure explained and questions answered to patient or proxy's satisfaction: yes     Patient identity confirmed:  Verbally with patient Location:    Type:  Bartholin cyst   Size:  2 cm   Location: Right labia. Pre-procedure details:    Skin preparation:  Povidone-iodine Sedation:    Sedation type:  None Anesthesia:    Anesthesia method:  Local infiltration   Local anesthetic:  Lidocaine 1% WITH epi Procedure type:    Complexity:  Simple Procedure details:    Ultrasound guidance: no     Needle aspiration: no     Incision types:  Single straight   Incision depth:  Dermal    Wound management:  Probed and deloculated   Drainage:  Purulent   Drainage amount:  Copious   Wound treatment:  Wound left open   Packing materials:  None Post-procedure details:    Procedure completion:  Tolerated well, no immediate complications     Medications Ordered in ED Medications  acetaminophen (TYLENOL) tablet 1,000 mg (1,000 mg Oral Given 05/08/22 1619)  ibuprofen (ADVIL) tablet 600 mg (600 mg Oral Given 05/08/22 1852)  oxyCODONE (Oxy IR/ROXICODONE) immediate release tablet 5 mg (5 mg Oral Given 05/08/22 1852)  lidocaine-EPINEPHrine (XYLOCAINE W/EPI) 2 %-1:200000 (PF) injection 10 mL (10 mLs Infiltration Given by Other 05/08/22 1907)    ED Course/ Medical Decision Making/ A&P                           Medical Decision Making This patient presents to the ED with chief complaint(s) of concern for Bartholin's gland abscess with no pertinent past medical history which further complicates the presenting complaint. The complaint involves an extensive differential diagnosis and also carries with it a high risk of complications and morbidity.    The differential diagnosis includes Bartholin's gland abscess, Bartholin cyst, no evidence of cellulitis   Additional history obtained: Additional history obtained from N/A Records reviewed prior ED visit  ED Course and Reassessment: Patient had evidence of fluctuance with visible and of the Bartholin's gland, concerning for Bartholin's gland abscess.  I&D was performed with copious purulent drainage.  No surrounding cellulitis and does not require antibiotics.  She was recommended OB/GYN follow-up for wound recheck.  Independent labs interpretation:  N/A  Independent visualization of imaging: N/A  Consultation: - Consulted or discussed management/test interpretation w/ external professional: N/A  Consideration for admission or further workup: Patient is stable for discharge with OB/GYN follow-up Social Determinants of health:  N/A    Risk OTC drugs. Prescription drug management.          Final Clinical Impression(s) / ED Diagnoses Final diagnoses:  None    Rx / DC Orders ED Discharge Orders     None         Kemper Durie, DO 05/08/22 2307

## 2022-05-08 NOTE — Discharge Instructions (Addendum)
You were seen in the emergency department for your Bartholin's abscess.  This was drained in the emergency department without any complications.  You should continue to do warm sits baths over the next few days to help encourage drainage.  You should not take Tylenol or Motrin as needed for pain.  You should follow-up with your OB/GYN in about 2 to 3 days to have your wound rechecked to make sure it is healing well.  You should return to the emergency department if having significantly worsening pain, you are having spreading redness or warmth from the site, you are having fevers or if you have any other new or concerning symptoms.

## 2022-05-28 ENCOUNTER — Other Ambulatory Visit: Payer: Medicaid Other

## 2022-05-28 ENCOUNTER — Encounter: Payer: Medicaid Other | Admitting: Physician Assistant

## 2023-01-20 ENCOUNTER — Inpatient Hospital Stay: Payer: Commercial Managed Care - PPO

## 2023-01-20 ENCOUNTER — Inpatient Hospital Stay: Payer: Commercial Managed Care - PPO | Admitting: Nurse Practitioner

## 2023-01-20 NOTE — Progress Notes (Deleted)
[facility]   [phone] [phone]   Clinic New consult Note   Patient Care Team: Center, [facility] as PCP - General 01/20/2023  CHIEF COMPLAINTS/PURPOSE OF CONSULTATION:  ***  HISTORY OF PRESENTING ILLNESS:  Julie Guerrero 47 y.o. female is here because of ***  ***She was found to have abnormal CBC from *** ***She denies recent chest pain on exertion, shortness of breath on minimal exertion, pre-syncopal episodes, or palpitations. ***She had not noticed any recent bleeding such as epistaxis, hematuria or hematochezia ***The patient denies over the counter NSAID ingestion. She is not *** on antiplatelets agents. Her last colonoscopy was *** ***She had no prior history or diagnosis of cancer. Her age appropriate screening programs are up-to-date. ***She denies any pica and eats a variety of diet. ***She never donated blood or received blood transfusion ***The patient was prescribed oral iron supplements and she takes ***  MEDICAL HISTORY:  Past Medical History:  Diagnosis Date   Chronic low back pain    Constipation    Edema, lower extremity    Hernia, inguinal, right    Hypertriglyceridemia 02/18/2019   Morbid obesity (HCC) 02/18/2019   Obesity    Prediabetes 02/18/2019   SOB (shortness of breath)     SURGICAL HISTORY: Past Surgical History:  Procedure Laterality Date   TUBAL LIGATION      SOCIAL HISTORY: Social History   Socioeconomic History   Marital status: Married    Spouse name: Ivar Drape   Number of children: Not on file   Years of education: Not on file   Highest education level: Not on file  Occupational History   Occupation: Engineer, production  Tobacco Use   Smoking status: Light Smoker    Packs/day: 0.10    Years: 6.00    Additional pack years: 0.00    Total pack years: 0.60    Types: Cigarettes   Smokeless tobacco: Never  Vaping Use   Vaping Use: Never used  Substance and Sexual Activity   Alcohol use: Yes     Comment: occ   Drug use: No   Sexual activity: Not on file  Other Topics Concern   Not on file  Social History Narrative   Not on file   Social Determinants of Health   Financial Resource Strain: Not on file  Food Insecurity: Not on file  Transportation Needs: Not on file  Physical Activity: Not on file  Stress: Not on file  Social Connections: Not on file  Intimate Partner Violence: Not on file    FAMILY HISTORY: Family History  Problem Relation Age of Onset   Hypertension Mother    Asthma Mother    Diabetes Mother    High Cholesterol Mother    High blood pressure Mother    Obesity Mother    Thyroid disease Maternal Grandmother    Diabetes Maternal Grandmother    Other Father        unknown   Diabetes Father    Obesity Father    Heart disease Neg Hx    Stroke Neg Hx     ALLERGIES:  has No Known Allergies.  MEDICATIONS:  Current Outpatient Medications  Medication Sig Dispense Refill   ibuprofen (ADVIL) 200 MG tablet Take 800 mg by mouth every 6 (six) hours as needed for headache or moderate pain.     No current facility-administered medications for this visit.    REVIEW OF SYSTEMS:   Constitutional: Denies fevers, chills or abnormal night sweats Eyes:  Denies blurriness of vision, double vision or watery eyes Ears, nose, mouth, throat, and face: Denies mucositis or sore throat Respiratory: Denies cough, dyspnea or wheezes Cardiovascular: Denies palpitation, chest discomfort or lower extremity swelling Gastrointestinal:  Denies nausea, heartburn or change in bowel habits Skin: Denies abnormal skin rashes Lymphatics: Denies new lymphadenopathy or easy bruising Neurological:Denies numbness, tingling or new weaknesses Behavioral/Psych: Mood is stable, no new changes  All other systems were reviewed with the patient and are negative.  PHYSICAL EXAMINATION: ECOG PERFORMANCE STATUS: {CHL ONC ECOG PS:[id number]}  There were no vitals filed for this visit. There  were no vitals filed for this visit.  GENERAL:alert, no distress and comfortable SKIN: skin color, texture, turgor are normal, no rashes or significant lesions EYES: normal, conjunctiva are pink and non-injected, sclera clear OROPHARYNX:no exudate, no erythema and lips, buccal mucosa, and tongue normal  NECK: supple, thyroid normal size, non-tender, without nodularity LYMPH:  no palpable lymphadenopathy in the cervical, axillary or inguinal LUNGS: clear to auscultation and percussion with normal breathing effort HEART: regular rate & rhythm and no murmurs and no lower extremity edema ABDOMEN:abdomen soft, non-tender and normal bowel sounds Musculoskeletal:no cyanosis of digits and no clubbing  PSYCH: alert & oriented x 3 with fluent speech NEURO: no focal motor/sensory deficits  LABORATORY DATA:  I have reviewed the data as listed    Latest Ref Rng & Units 12/08/2020   12:19 AM 12/07/2020    2:04 AM 12/06/2020   12:56 PM  CBC  WBC 4.0 - 10.5 K/uL 12.7  17.9  21.0   Hemoglobin 12.0 - 15.0 g/dL 9.4  9.6  16.1   Hematocrit 36.0 - 46.0 % 30.2  31.0  35.5   Platelets 150 - 400 K/uL 276  277  324        Latest Ref Rng & Units 12/07/2020    2:04 AM 12/06/2020   12:56 PM 06/17/2019   10:35 AM  CMP  Glucose 70 - 99 mg/dL 096  045  99   BUN 6 - 20 mg/dL 5  7  15    Creatinine 0.44 - 1.00 mg/dL 4.09  8.11  9.14   Sodium 135 - 145 mmol/L 133  132  141   Potassium 3.5 - 5.1 mmol/L 3.2  3.5  4.0   Chloride 98 - 111 mmol/L 99  98  105   CO2 22 - 32 mmol/L 23  23  22    Calcium 8.9 - 10.3 mg/dL 8.4  8.9  8.9   Total Protein 6.5 - 8.1 g/dL  6.8  6.7   Total Bilirubin 0.3 - 1.2 mg/dL  0.5  <7.8   Alkaline Phos 38 - 126 U/L  41  49   AST 15 - 41 U/L  16  27   ALT 0 - 44 U/L  14  22      RADIOGRAPHIC STUDIES: I have personally reviewed the radiological images as listed and agreed with the findings in the report. No results found.  ASSESSMENT & PLAN:  No problem-specific Assessment & Plan  notes found for this encounter.    All questions were answered. The patient knows to call the clinic with any problems, questions or concerns. I spent {CHL ONC TIME VISIT - GNFAO:[id number]} counseling the patient face to face. The total time spent in the appointment was {CHL ONC TIME VISIT - NGEXB:[id number]} and more than 50% was on counseling.     Pollyann Samples, NP 01/20/23 8:15 AM

## 2023-02-24 ENCOUNTER — Inpatient Hospital Stay: Payer: Commercial Managed Care - PPO | Attending: Nurse Practitioner | Admitting: Hematology and Oncology

## 2023-02-24 ENCOUNTER — Other Ambulatory Visit: Payer: Self-pay

## 2023-02-24 ENCOUNTER — Inpatient Hospital Stay: Payer: Commercial Managed Care - PPO

## 2023-02-24 ENCOUNTER — Encounter: Payer: Self-pay | Admitting: Hematology and Oncology

## 2023-02-24 VITALS — BP 129/85 | HR 68 | Temp 98.4°F | Wt 156.2 lb

## 2023-02-24 DIAGNOSIS — R87613 High grade squamous intraepithelial lesion on cytologic smear of cervix (HGSIL): Secondary | ICD-10-CM | POA: Diagnosis not present

## 2023-02-24 DIAGNOSIS — D75838 Other thrombocytosis: Secondary | ICD-10-CM | POA: Insufficient documentation

## 2023-02-24 DIAGNOSIS — F17210 Nicotine dependence, cigarettes, uncomplicated: Secondary | ICD-10-CM | POA: Insufficient documentation

## 2023-02-24 DIAGNOSIS — D500 Iron deficiency anemia secondary to blood loss (chronic): Secondary | ICD-10-CM | POA: Diagnosis not present

## 2023-02-24 DIAGNOSIS — N926 Irregular menstruation, unspecified: Secondary | ICD-10-CM | POA: Insufficient documentation

## 2023-02-24 DIAGNOSIS — Z79899 Other long term (current) drug therapy: Secondary | ICD-10-CM | POA: Diagnosis not present

## 2023-02-24 NOTE — Assessment & Plan Note (Signed)
We discussed the importance of smoking cessation and she will attempt to quit smoking

## 2023-02-24 NOTE — Assessment & Plan Note (Signed)
As above, we discussed importance of GYN follow-up Will check a TSH in her next visit

## 2023-02-24 NOTE — Progress Notes (Signed)
[facility] CONSULT NOTE  Patient Care Team: Center, [facility] as PCP - General  ASSESSMENT & PLAN:  Iron deficiency anemia due to chronic blood loss The most likely cause of her anemia is due to chronic blood loss/malabsorption syndrome. We discussed some of the risks, benefits, and alternatives of intravenous iron infusions. The patient is symptomatic from anemia and the iron level is critically low. She tolerated oral iron supplement poorly and desires to achieved higher levels of iron faster for adequate hematopoesis. Some of the side-effects to be expected including risks of infusion reactions, phlebitis, headaches, nausea and fatigue.  The patient is willing to proceed. Patient education material was dispensed.  Goal is to keep ferritin level greater than 50 and resolution of anemia  Recommend 3 doses of intravenous iron sucrose at 400 mg I plan to see her back in 3 months I recommend GI referral as well as GYN follow-up for history of abnormal Pap smear and severe pelvic pain with menstrual cycle   High grade squamous intraepithelial lesion (HGSIL) on cytologic smear of cervix According to the patient, her recent Pap smear was not normal I recommend GI referral I recommend the patient to avoid NSAID during her menstrual cycle to reduce risk of bleeding  Irregular menstruation As above, we discussed importance of GYN follow-up Will check a TSH in her next visit  Cigarette smoker We discussed the importance of smoking cessation and she will attempt to quit smoking Orders Placed This Encounter  Procedures   CBC with Differential (Cancer Center Only)    Standing Status:   Future    Standing Expiration Date:   02/24/2024   CMP (Cancer Center only)    Standing Status:   Future    Standing Expiration Date:   02/24/2024   Iron and Iron Binding Capacity ([facility] only)    Standing Status:   Future    Standing Expiration Date:   02/24/2024   Ferritin    Standing  Status:   Future    Standing Expiration Date:   02/24/2024   Sedimentation rate    Standing Status:   Future    Standing Expiration Date:   02/24/2024   Ambulatory referral to Gastroenterology    Referral Priority:   Routine    Referral Type:   Consultation    Referral Reason:   Specialty Services Required    Number of Visits Requested:   1   ABO/Rh    Standing Status:   Future    Standing Expiration Date:   02/24/2024    All questions were answered. The patient knows to call the clinic with any problems, questions or concerns.  The total time spent in the appointment was 60 minutes encounter with patients including review of chart and various tests results, discussions about plan of care and coordination of care plan  Artis Delay, MD 7/15/20243:00 PM   CHIEF COMPLAINTS/PURPOSE OF CONSULTATION:  Anemia  HISTORY OF PRESENTING ILLNESS:  Julie Guerrero 47 y.o. female is here because of anemia  She was found to have abnormal CBC from recent blood work I have the opportunity to review his CBC dated back to 2017 On May 16, 2016, hemoglobin has been as high as 11.3 Serial blood work was included in her referral On March 8, hemoglobin was 9.1 with platelet count of 650 On March 19, hemoglobin 9.5 On April 12, hemoglobin 9.8 with ferritin of 9.7 On May 13, hemoglobin was 11 with a platelet count of 422  She denies recent chest pain on exertion, shortness of breath on minimal exertion, pre-syncopal episodes, or palpitations. She complained of excessive fatigue She had not noticed any recent bleeding such as epistaxis, hematuria or hematochezia The patient takes over the counter NSAID for severe premenstrual cramping The patient have history of abnormal Pap smear since 2011 She has not have routine screening done for some time due to lack of insurance but had repeat Pap smear again just a month ago and similar findings was noted Referral to GYN is in progress but she has not heard  back She have irregular menstruation in the past few months Her menstrual cycle typically last at least 7 days and is irregular She has not had screening colonoscopy She has pica with ice craving but she eats a variety of diet. She has intentional weight loss of over 100 pounds with medical intervention She never donated blood or received blood transfusion The patient was prescribed oral iron supplements and she takes 1 daily for several months  MEDICAL HISTORY:  Past Medical History:  Diagnosis Date   Chronic low back pain    Constipation    Edema, lower extremity    Hernia, inguinal, right    Hypertriglyceridemia 02/18/2019   Morbid obesity (HCC) 02/18/2019   Obesity    Prediabetes 02/18/2019   SOB (shortness of breath)     SURGICAL HISTORY: Past Surgical History:  Procedure Laterality Date   TUBAL LIGATION      SOCIAL HISTORY: Social History   Socioeconomic History   Marital status: Married    Spouse name: Ivar Drape   Number of children: 3   Years of education: Not on file   Highest education level: Not on file  Occupational History   Occupation: Engineer, production   Occupation: bus driver  Tobacco Use   Smoking status: Light Smoker    Current packs/day: 0.50    Average packs/day: 0.5 packs/day for 20.0 years (10.0 ttl pk-yrs)    Types: Cigarettes    Start date: 02/24/2003   Smokeless tobacco: Never  Vaping Use   Vaping status: Never Used  Substance and Sexual Activity   Alcohol use: Yes    Comment: occ   Drug use: No   Sexual activity: Not on file  Other Topics Concern   Not on file  Social History Narrative   Not on file   Social Determinants of Health   Financial Resource Strain: Not on file  Food Insecurity: Not on file  Transportation Needs: Not on file  Physical Activity: Not on file  Stress: Not on file  Social Connections: Unknown (10/28/2022)   Received from [facility]   Social Network    Social Network: Not on file  Intimate Partner  Violence: Unknown (10/28/2022)   Received from [facility]   HITS    Physically Hurt: Not on file    Insult or Talk Down To: Not on file    Threaten Physical Harm: Not on file    Scream or Curse: Not on file    FAMILY HISTORY: Family History  Problem Relation Age of Onset   Hypertension Mother    Asthma Mother    Diabetes Mother    High Cholesterol Mother    High blood pressure Mother    Obesity Mother    Thyroid disease Maternal Grandmother    Diabetes Maternal Grandmother    Other Father        unknown   Diabetes Father    Obesity  Father    Heart disease Neg Hx    Stroke Neg Hx     ALLERGIES:  has No Known Allergies.  MEDICATIONS:  Current Outpatient Medications  Medication Sig Dispense Refill   ergocalciferol (VITAMIN D2) 1.25 MG (50000 UT) capsule Take 50,000 Units by mouth once a week.     ibuprofen (ADVIL) 200 MG tablet Take 800 mg by mouth every 6 (six) hours as needed for headache or moderate pain.     OZEMPIC, 1 MG/DOSE, 4 MG/3ML SOPN Inject 1 mg into the skin once a week.     phentermine (ADIPEX-P) 37.5 MG tablet Take 37.5 mg by mouth daily.     topiramate (TOPAMAX) 100 MG tablet Take 100 mg by mouth daily.     No current facility-administered medications for this visit.    REVIEW OF SYSTEMS:   Constitutional: Denies fevers, chills or abnormal night sweats Eyes: Denies blurriness of vision, double vision or watery eyes Ears, nose, mouth, throat, and face: Denies mucositis or sore throat Respiratory: Denies cough, dyspnea or wheezes Cardiovascular: Denies palpitation, chest discomfort or lower extremity swelling Gastrointestinal:  Denies nausea, heartburn or change in bowel habits Skin: Denies abnormal skin rashes Lymphatics: Denies new lymphadenopathy or easy bruising Neurological:Denies numbness, tingling or new weaknesses Behavioral/Psych: Mood is stable, no new changes  All other systems were reviewed with the patient and are  negative.  PHYSICAL EXAMINATION: ECOG PERFORMANCE STATUS: 1 - Symptomatic but completely ambulatory  Vitals:   02/24/23 1415  BP: 129/85  Pulse: 68  Temp: 98.4 F (36.9 C)  SpO2: 100%   Filed Weights   02/24/23 1415  Weight: 156 lb 3.2 oz (70.9 kg)    GENERAL:alert, no distress and comfortable SKIN: skin color, texture, turgor are normal, no rashes or significant lesions EYES: normal, conjunctiva are pale and non-injected, sclera clear OROPHARYNX:no exudate, no erythema and lips, buccal mucosa, and tongue normal  NECK: supple, thyroid normal size, non-tender, without nodularity LYMPH:  no palpable lymphadenopathy in the cervical, axillary or inguinal LUNGS: clear to auscultation and percussion with normal breathing effort HEART: regular rate & rhythm and no murmurs and no lower extremity edema ABDOMEN:abdomen soft, non-tender and normal bowel sounds Musculoskeletal:no cyanosis of digits and no clubbing  PSYCH: alert & oriented x 3 with fluent speech NEURO: no focal motor/sensory deficits

## 2023-02-24 NOTE — Assessment & Plan Note (Signed)
According to the patient, her recent Pap smear was not normal I recommend GI referral I recommend the patient to avoid NSAID during her menstrual cycle to reduce risk of bleeding

## 2023-02-24 NOTE — Assessment & Plan Note (Signed)
The most likely cause of her anemia is due to chronic blood loss/malabsorption syndrome. We discussed some of the risks, benefits, and alternatives of intravenous iron infusions. The patient is symptomatic from anemia and the iron level is critically low. She tolerated oral iron supplement poorly and desires to achieved higher levels of iron faster for adequate hematopoesis. Some of the side-effects to be expected including risks of infusion reactions, phlebitis, headaches, nausea and fatigue.  The patient is willing to proceed. Patient education material was dispensed.  Goal is to keep ferritin level greater than 50 and resolution of anemia  Recommend 3 doses of intravenous iron sucrose at 400 mg I plan to see her back in 3 months I recommend GI referral as well as GYN follow-up for history of abnormal Pap smear and severe pelvic pain with menstrual cycle

## 2023-03-04 ENCOUNTER — Inpatient Hospital Stay: Payer: Commercial Managed Care - PPO

## 2023-03-11 ENCOUNTER — Inpatient Hospital Stay: Payer: Commercial Managed Care - PPO

## 2023-03-12 ENCOUNTER — Inpatient Hospital Stay: Payer: Commercial Managed Care - PPO

## 2023-03-18 ENCOUNTER — Inpatient Hospital Stay: Payer: Commercial Managed Care - PPO

## 2023-03-19 ENCOUNTER — Inpatient Hospital Stay: Payer: Commercial Managed Care - PPO | Attending: Nurse Practitioner

## 2023-03-19 ENCOUNTER — Other Ambulatory Visit: Payer: Self-pay

## 2023-03-19 VITALS — BP 125/77 | HR 56 | Temp 98.5°F | Wt 155.5 lb

## 2023-03-19 DIAGNOSIS — R87613 High grade squamous intraepithelial lesion on cytologic smear of cervix (HGSIL): Secondary | ICD-10-CM | POA: Insufficient documentation

## 2023-03-19 DIAGNOSIS — F17210 Nicotine dependence, cigarettes, uncomplicated: Secondary | ICD-10-CM | POA: Insufficient documentation

## 2023-03-19 DIAGNOSIS — D500 Iron deficiency anemia secondary to blood loss (chronic): Secondary | ICD-10-CM | POA: Diagnosis present

## 2023-03-19 DIAGNOSIS — N920 Excessive and frequent menstruation with regular cycle: Secondary | ICD-10-CM | POA: Diagnosis not present

## 2023-03-19 DIAGNOSIS — Z79899 Other long term (current) drug therapy: Secondary | ICD-10-CM | POA: Insufficient documentation

## 2023-03-19 MED ORDER — SODIUM CHLORIDE 0.9 % IV SOLN
400.0000 mg | Freq: Once | INTRAVENOUS | Status: AC
  Administered 2023-03-19: 400 mg via INTRAVENOUS
  Filled 2023-03-19: qty 400

## 2023-03-19 MED ORDER — SODIUM CHLORIDE 0.9 % IV SOLN: Freq: Once | INTRAVENOUS | Status: AC

## 2023-03-19 NOTE — Patient Instructions (Signed)
Iron Sucrose Injection What is this medication? IRON SUCROSE (EYE ern SOO krose) treats low levels of iron (iron deficiency anemia) in people with kidney disease. Iron is a mineral that plays an important role in making red blood cells, which carry oxygen from your lungs to the rest of your body. This medicine may be used for other purposes; ask your health care provider or pharmacist if you have questions. COMMON BRAND NAME(S): Venofer What should I tell my care team before I take this medication? They need to know if you have any of these conditions: Anemia not caused by low iron levels Heart disease High levels of iron in the blood Kidney disease Liver disease An unusual or allergic reaction to iron, other medications, foods, dyes, or preservatives Pregnant or trying to get pregnant Breastfeeding How should I use this medication? This medication is for infusion into a vein. It is given in a hospital or clinic setting. Talk to your care team about the use of this medication in children. While this medication may be prescribed for children as young as 2 years for selected conditions, precautions do apply. Overdosage: If you think you have taken too much of this medicine contact a poison control center or emergency room at once. NOTE: This medicine is only for you. Do not share this medicine with others. What if I miss a dose? Keep appointments for follow-up doses. It is important not to miss your dose. Call your care team if you are unable to keep an appointment. What may interact with this medication? Do not take this medication with any of the following: Deferoxamine Dimercaprol Other iron products This medication may also interact with the following: Chloramphenicol Deferasirox This list may not describe all possible interactions. Give your health care provider a list of all the medicines, herbs, non-prescription drugs, or dietary supplements you use. Also tell them if you smoke,  drink alcohol, or use illegal drugs. Some items may interact with your medicine. What should I watch for while using this medication? Visit your care team regularly. Tell your care team if your symptoms do not start to get better or if they get worse. You may need blood work done while you are taking this medication. You may need to follow a special diet. Talk to your care team. Foods that contain iron include: whole grains/cereals, dried fruits, beans, or peas, leafy green vegetables, and organ meats (liver, kidney). What side effects may I notice from receiving this medication? Side effects that you should report to your care team as soon as possible: Allergic reactions--skin rash, itching, hives, swelling of the face, lips, tongue, or throat Low blood pressure--dizziness, feeling faint or lightheaded, blurry vision Shortness of breath Side effects that usually do not require medical attention (report to your care team if they continue or are bothersome): Flushing Headache Joint pain Muscle pain Nausea Pain, redness, or irritation at injection site This list may not describe all possible side effects. Call your doctor for medical advice about side effects. You may report side effects to FDA at 1-800-FDA-1088. Where should I keep my medication? This medication is given in a hospital or clinic. It will not be stored at home. NOTE: This sheet is a summary. It may not cover all possible information. If you have questions about this medicine, talk to your doctor, pharmacist, or health care provider.  2024 Elsevier/Gold Standard (2023-01-03 00:00:00)

## 2023-03-20 ENCOUNTER — Telehealth: Payer: Self-pay | Admitting: Hematology and Oncology

## 2023-03-20 NOTE — Telephone Encounter (Signed)
Left patient a message about rescheduled appointment times/dates for infusions

## 2023-03-26 ENCOUNTER — Inpatient Hospital Stay: Payer: Commercial Managed Care - PPO

## 2023-03-26 ENCOUNTER — Other Ambulatory Visit: Payer: Self-pay

## 2023-03-26 VITALS — BP 116/81 | HR 63 | Temp 98.2°F | Resp 17

## 2023-03-26 DIAGNOSIS — D500 Iron deficiency anemia secondary to blood loss (chronic): Secondary | ICD-10-CM

## 2023-03-26 MED ORDER — SODIUM CHLORIDE 0.9 % IV SOLN: Freq: Once | INTRAVENOUS | Status: AC

## 2023-03-26 MED ORDER — SODIUM CHLORIDE 0.9 % IV SOLN
400.0000 mg | Freq: Once | INTRAVENOUS | Status: AC
  Administered 2023-03-26: 400 mg via INTRAVENOUS
  Filled 2023-03-26: qty 400

## 2023-03-26 NOTE — Progress Notes (Signed)
Pt declined to stay for 30 min post obs, discharged with VSS, ambulatory to lobby ?

## 2023-03-26 NOTE — Patient Instructions (Signed)
 Iron Sucrose Injection What is this medication? IRON SUCROSE (EYE ern SOO krose) treats low levels of iron (iron deficiency anemia) in people with kidney disease. Iron is a mineral that plays an important role in making red blood cells, which carry oxygen from your lungs to the rest of your body. This medicine may be used for other purposes; ask your health care provider or pharmacist if you have questions. COMMON BRAND NAME(S): Venofer What should I tell my care team before I take this medication? They need to know if you have any of these conditions: Anemia not caused by low iron levels Heart disease High levels of iron in the blood Kidney disease Liver disease An unusual or allergic reaction to iron, other medications, foods, dyes, or preservatives Pregnant or trying to get pregnant Breastfeeding How should I use this medication? This medication is for infusion into a vein. It is given in a hospital or clinic setting. Talk to your care team about the use of this medication in children. While this medication may be prescribed for children as young as 2 years for selected conditions, precautions do apply. Overdosage: If you think you have taken too much of this medicine contact a poison control center or emergency room at once. NOTE: This medicine is only for you. Do not share this medicine with others. What if I miss a dose? Keep appointments for follow-up doses. It is important not to miss your dose. Call your care team if you are unable to keep an appointment. What may interact with this medication? Do not take this medication with any of the following: Deferoxamine Dimercaprol Other iron products This medication may also interact with the following: Chloramphenicol Deferasirox This list may not describe all possible interactions. Give your health care provider a list of all the medicines, herbs, non-prescription drugs, or dietary supplements you use. Also tell them if you smoke,  drink alcohol, or use illegal drugs. Some items may interact with your medicine. What should I watch for while using this medication? Visit your care team regularly. Tell your care team if your symptoms do not start to get better or if they get worse. You may need blood work done while you are taking this medication. You may need to follow a special diet. Talk to your care team. Foods that contain iron include: whole grains/cereals, dried fruits, beans, or peas, leafy green vegetables, and organ meats (liver, kidney). What side effects may I notice from receiving this medication? Side effects that you should report to your care team as soon as possible: Allergic reactions--skin rash, itching, hives, swelling of the face, lips, tongue, or throat Low blood pressure--dizziness, feeling faint or lightheaded, blurry vision Shortness of breath Side effects that usually do not require medical attention (report to your care team if they continue or are bothersome): Flushing Headache Joint pain Muscle pain Nausea Pain, redness, or irritation at injection site This list may not describe all possible side effects. Call your doctor for medical advice about side effects. You may report side effects to FDA at 1-800-FDA-1088. Where should I keep my medication? This medication is given in a hospital or clinic. It will not be stored at home. NOTE: This sheet is a summary. It may not cover all possible information. If you have questions about this medicine, talk to your doctor, pharmacist, or health care provider.  2024 Elsevier/Gold Standard (2023-01-03 00:00:00)

## 2023-04-02 ENCOUNTER — Inpatient Hospital Stay: Payer: Commercial Managed Care - PPO

## 2023-04-02 VITALS — BP 128/82 | HR 58 | Temp 97.7°F | Resp 18

## 2023-04-02 DIAGNOSIS — D500 Iron deficiency anemia secondary to blood loss (chronic): Secondary | ICD-10-CM | POA: Diagnosis not present

## 2023-04-02 MED ORDER — SODIUM CHLORIDE 0.9 % IV SOLN
400.0000 mg | Freq: Once | INTRAVENOUS | Status: AC
  Administered 2023-04-02: 400 mg via INTRAVENOUS
  Filled 2023-04-02: qty 400

## 2023-04-02 MED ORDER — SODIUM CHLORIDE 0.9 % IV SOLN: INTRAVENOUS | Status: DC

## 2023-04-02 NOTE — Progress Notes (Signed)
Pt declined to stay for 30 min observation post Venofer infusion. VSS and no complaints at time of discharge.

## 2023-04-02 NOTE — Patient Instructions (Signed)
 Iron Sucrose Injection What is this medication? IRON SUCROSE (EYE ern SOO krose) treats low levels of iron (iron deficiency anemia) in people with kidney disease. Iron is a mineral that plays an important role in making red blood cells, which carry oxygen from your lungs to the rest of your body. This medicine may be used for other purposes; ask your health care provider or pharmacist if you have questions. COMMON BRAND NAME(S): Venofer What should I tell my care team before I take this medication? They need to know if you have any of these conditions: Anemia not caused by low iron levels Heart disease High levels of iron in the blood Kidney disease Liver disease An unusual or allergic reaction to iron, other medications, foods, dyes, or preservatives Pregnant or trying to get pregnant Breastfeeding How should I use this medication? This medication is for infusion into a vein. It is given in a hospital or clinic setting. Talk to your care team about the use of this medication in children. While this medication may be prescribed for children as young as 2 years for selected conditions, precautions do apply. Overdosage: If you think you have taken too much of this medicine contact a poison control center or emergency room at once. NOTE: This medicine is only for you. Do not share this medicine with others. What if I miss a dose? Keep appointments for follow-up doses. It is important not to miss your dose. Call your care team if you are unable to keep an appointment. What may interact with this medication? Do not take this medication with any of the following: Deferoxamine Dimercaprol Other iron products This medication may also interact with the following: Chloramphenicol Deferasirox This list may not describe all possible interactions. Give your health care provider a list of all the medicines, herbs, non-prescription drugs, or dietary supplements you use. Also tell them if you smoke,  drink alcohol, or use illegal drugs. Some items may interact with your medicine. What should I watch for while using this medication? Visit your care team regularly. Tell your care team if your symptoms do not start to get better or if they get worse. You may need blood work done while you are taking this medication. You may need to follow a special diet. Talk to your care team. Foods that contain iron include: whole grains/cereals, dried fruits, beans, or peas, leafy green vegetables, and organ meats (liver, kidney). What side effects may I notice from receiving this medication? Side effects that you should report to your care team as soon as possible: Allergic reactions--skin rash, itching, hives, swelling of the face, lips, tongue, or throat Low blood pressure--dizziness, feeling faint or lightheaded, blurry vision Shortness of breath Side effects that usually do not require medical attention (report to your care team if they continue or are bothersome): Flushing Headache Joint pain Muscle pain Nausea Pain, redness, or irritation at injection site This list may not describe all possible side effects. Call your doctor for medical advice about side effects. You may report side effects to FDA at 1-800-FDA-1088. Where should I keep my medication? This medication is given in a hospital or clinic. It will not be stored at home. NOTE: This sheet is a summary. It may not cover all possible information. If you have questions about this medicine, talk to your doctor, pharmacist, or health care provider.  2024 Elsevier/Gold Standard (2023-01-03 00:00:00)

## 2023-05-01 ENCOUNTER — Encounter: Payer: Self-pay | Admitting: Hematology and Oncology

## 2023-05-22 ENCOUNTER — Encounter: Payer: Self-pay | Admitting: Hematology and Oncology

## 2023-06-04 ENCOUNTER — Encounter: Payer: Commercial Managed Care - PPO | Admitting: Obstetrics and Gynecology

## 2023-06-19 ENCOUNTER — Other Ambulatory Visit: Payer: Self-pay

## 2023-06-19 ENCOUNTER — Encounter: Payer: Self-pay | Admitting: Hematology and Oncology

## 2023-06-19 ENCOUNTER — Inpatient Hospital Stay: Payer: Commercial Managed Care - PPO | Attending: Nurse Practitioner

## 2023-06-19 ENCOUNTER — Inpatient Hospital Stay: Payer: Commercial Managed Care - PPO | Admitting: Hematology and Oncology

## 2023-06-19 VITALS — BP 122/80 | HR 67 | Temp 97.5°F | Resp 18 | Ht 67.0 in | Wt 150.0 lb

## 2023-06-19 DIAGNOSIS — D539 Nutritional anemia, unspecified: Secondary | ICD-10-CM

## 2023-06-19 DIAGNOSIS — N920 Excessive and frequent menstruation with regular cycle: Secondary | ICD-10-CM | POA: Diagnosis present

## 2023-06-19 DIAGNOSIS — Z79899 Other long term (current) drug therapy: Secondary | ICD-10-CM | POA: Insufficient documentation

## 2023-06-19 DIAGNOSIS — D75838 Other thrombocytosis: Secondary | ICD-10-CM | POA: Diagnosis not present

## 2023-06-19 DIAGNOSIS — N926 Irregular menstruation, unspecified: Secondary | ICD-10-CM | POA: Diagnosis not present

## 2023-06-19 DIAGNOSIS — D500 Iron deficiency anemia secondary to blood loss (chronic): Secondary | ICD-10-CM

## 2023-06-19 DIAGNOSIS — R87613 High grade squamous intraepithelial lesion on cytologic smear of cervix (HGSIL): Secondary | ICD-10-CM

## 2023-06-19 LAB — IRON AND IRON BINDING CAPACITY (CC-WL,HP ONLY)
Iron: 83 ug/dL (ref 28–170)
Saturation Ratios: 26 % (ref 10.4–31.8)
TIBC: 322 ug/dL (ref 250–450)
UIBC: 239 ug/dL (ref 148–442)

## 2023-06-19 LAB — CMP (CANCER CENTER ONLY)
ALT: 12 U/L (ref 0–44)
AST: 15 U/L (ref 15–41)
Albumin: 3.8 g/dL (ref 3.5–5.0)
Alkaline Phosphatase: 25 U/L — ABNORMAL LOW (ref 38–126)
Anion gap: 6 (ref 5–15)
BUN: 18 mg/dL (ref 6–20)
CO2: 26 mmol/L (ref 22–32)
Calcium: 8.5 mg/dL — ABNORMAL LOW (ref 8.9–10.3)
Chloride: 109 mmol/L (ref 98–111)
Creatinine: 1.08 mg/dL — ABNORMAL HIGH (ref 0.44–1.00)
GFR, Estimated: >60 mL/min (ref >60)
Glucose, Bld: 77 mg/dL (ref 70–99)
Potassium: 3.9 mmol/L (ref 3.5–5.1)
Sodium: 141 mmol/L (ref 135–145)
Total Bilirubin: 0.4 mg/dL (ref <1.2)
Total Protein: 6.5 g/dL (ref 6.5–8.1)

## 2023-06-19 LAB — CBC WITH DIFFERENTIAL (CANCER CENTER ONLY)
Abs Immature Granulocytes: 0.01 10*3/uL (ref 0.00–0.07)
Basophils Absolute: 0.1 10*3/uL (ref 0.0–0.1)
Basophils Relative: 2 %
Eosinophils Absolute: 0.2 10*3/uL (ref 0.0–0.5)
Eosinophils Relative: 4 %
HCT: 33.5 % — ABNORMAL LOW (ref 36.0–46.0)
Hemoglobin: 10.5 g/dL — ABNORMAL LOW (ref 12.0–15.0)
Immature Granulocytes: 0 %
Lymphocytes Relative: 35 %
Lymphs Abs: 2 10*3/uL (ref 0.7–4.0)
MCH: 27.4 pg (ref 26.0–34.0)
MCHC: 31.3 g/dL (ref 30.0–36.0)
MCV: 87.5 fL (ref 80.0–100.0)
Monocytes Absolute: 0.5 10*3/uL (ref 0.1–1.0)
Monocytes Relative: 10 %
Neutro Abs: 2.8 10*3/uL (ref 1.7–7.7)
Neutrophils Relative %: 49 %
Platelet Count: 388 10*3/uL (ref 150–400)
RBC: 3.83 MIL/uL — ABNORMAL LOW (ref 3.87–5.11)
RDW: 13.2 % (ref 11.5–15.5)
WBC Count: 5.6 10*3/uL (ref 4.0–10.5)
nRBC: 0 % (ref 0.0–0.2)

## 2023-06-19 LAB — VITAMIN B12: Vitamin B-12: 502 pg/mL (ref 180–914)

## 2023-06-19 LAB — FERRITIN: Ferritin: 77 ng/mL (ref 11–307)

## 2023-06-19 LAB — ABO/RH: ABO/RH(D): A NEG

## 2023-06-19 LAB — SAMPLE TO BLOOD BANK

## 2023-06-19 LAB — SEDIMENTATION RATE: Sed Rate: 5 mm/h (ref 0–22)

## 2023-06-19 NOTE — Assessment & Plan Note (Signed)
She has appointment to see gynecologist next month for further evaluation

## 2023-06-19 NOTE — Assessment & Plan Note (Addendum)
She remains anemic despite 3 doses of intravenous iron due to ongoing chronic menorrhagia and gastroenterologist for evaluation for possible GI bleed I will call her with results of her iron studies and other labs If she remained iron deficient, I will bring her back for more intravenous iron infusion

## 2023-06-19 NOTE — Assessment & Plan Note (Signed)
This has resolved.

## 2023-06-19 NOTE — Progress Notes (Signed)
[facility] OFFICE PROGRESS NOTE  [facility], [facility]  ASSESSMENT & PLAN:  Iron deficiency anemia due to chronic blood loss She remains anemic despite 3 doses of intravenous iron due to ongoing chronic menorrhagia and gastroenterologist for evaluation for possible GI bleed I will call her with results of her iron studies and other labs If she remained iron deficient, I will bring her back for more intravenous iron infusion   Irregular menstruation She has appointment to see gynecologist next month for further evaluation  Reactive thrombocytosis This has resolved  Orders Placed This Encounter  Procedures   Vitamin B12    Standing Status:   Future    Number of Occurrences:   1    Standing Expiration Date:   06/18/2024    The total time spent in the appointment was 20 minutes encounter with patients including review of chart and various tests results, discussions about plan of care and coordination of care plan   All questions were answered. The patient knows to call the clinic with any problems, questions or concerns. No barriers to learning was detected.    Artis Delay, MD 11/7/20241:24 PM  INTERVAL HISTORY: Julie Guerrero 47 y.o. female returns for review test results She tolerated intravenous iron infusion well She continues to complain of fatigue She continues to have irregular menstruation She has both GI appointment and gynecologist appointment pending She denies other sites of bleeding  SUMMARY OF HEMATOLOGIC HISTORY:  Julie Guerrero 47 y.o. female is here because of anemia  She was found to have abnormal CBC from recent blood work I have the opportunity to review his CBC dated back to 2017 On May 16, 2016, hemoglobin has been as high as 11.3 Serial blood work was included in her referral On March 8, hemoglobin was 9.1 with platelet count of 650 On March 19, hemoglobin 9.5 On April 12, hemoglobin 9.8 with ferritin of 9.7 On May 13,  hemoglobin was 11 with a platelet count of 422 She denies recent chest pain on exertion, shortness of breath on minimal exertion, pre-syncopal episodes, or palpitations. She complained of excessive fatigue She had not noticed any recent bleeding such as epistaxis, hematuria or hematochezia The patient takes over the counter NSAID for severe premenstrual cramping The patient have history of abnormal Pap smear since 2011 She has not have routine screening done for some time due to lack of insurance but had repeat Pap smear again just a month ago and similar findings was noted Referral to GYN is in progress but she has not heard back She have irregular menstruation in the past few months Her menstrual cycle typically last at least 7 days and is irregular She has not had screening colonoscopy She has pica with ice craving but she eats a variety of diet. She has intentional weight loss of over 100 pounds with medical intervention She never donated blood or received blood transfusion The patient was prescribed oral iron supplements and she takes 1 daily for several months In August 2024, she received 3 doses of intravenous iron sucrose  I have reviewed the past medical history, past surgical history, social history and family history with the patient and they are unchanged from previous note.  ALLERGIES:  has No Known Allergies.  MEDICATIONS:  Current Outpatient Medications  Medication Sig Dispense Refill   ergocalciferol (VITAMIN D2) 1.25 MG (50000 UT) capsule Take 50,000 Units by mouth once a week.     ibuprofen (ADVIL) 200 MG tablet  Take 800 mg by mouth every 6 (six) hours as needed for headache or moderate pain.     OZEMPIC, 1 MG/DOSE, 4 MG/3ML SOPN Inject 1 mg into the skin once a week.     phentermine (ADIPEX-P) 37.5 MG tablet Take 37.5 mg by mouth daily.     topiramate (TOPAMAX) 100 MG tablet Take 100 mg by mouth daily.     No current facility-administered medications for this visit.      REVIEW OF SYSTEMS:   Constitutional: Denies fevers, chills or night sweats Eyes: Denies blurriness of vision Ears, nose, mouth, throat, and face: Denies mucositis or sore throat Respiratory: Denies cough, dyspnea or wheezes Cardiovascular: Denies palpitation, chest discomfort or lower extremity swelling Gastrointestinal:  Denies nausea, heartburn or change in bowel habits Skin: Denies abnormal skin rashes Lymphatics: Denies new lymphadenopathy or easy bruising Neurological:Denies numbness, tingling or new weaknesses Behavioral/Psych: Mood is stable, no new changes  All other systems were reviewed with the patient and are negative.  PHYSICAL EXAMINATION: ECOG PERFORMANCE STATUS: 1 - Symptomatic but completely ambulatory  Vitals:   06/19/23 1256  BP: 122/80  Pulse: 67  Resp: 18  Temp: (!) 97.5 F (36.4 C)  SpO2: 97%   Filed Weights   06/19/23 1256  Weight: 150 lb (68 kg)    GENERAL:alert, no distress and comfortable   LABORATORY DATA:  I have reviewed the data as listed     Component Value Date/Time   NA 133 (L) 12/07/2020 0204   NA 141 06/17/2019 1035   K 3.2 (L) 12/07/2020 0204   CL 99 12/07/2020 0204   CO2 23 12/07/2020 0204   GLUCOSE 112 (H) 12/07/2020 0204   BUN 5 (L) 12/07/2020 0204   BUN 15 06/17/2019 1035   CREATININE 0.78 12/07/2020 0204   CALCIUM 8.4 (L) 12/07/2020 0204   PROT 6.8 12/06/2020 1256   PROT 6.7 06/17/2019 1035   ALBUMIN 3.4 (L) 12/06/2020 1256   ALBUMIN 4.0 06/17/2019 1035   AST 16 12/06/2020 1256   ALT 14 12/06/2020 1256   ALKPHOS 41 12/06/2020 1256   BILITOT 0.5 12/06/2020 1256   BILITOT <0.2 06/17/2019 1035   GFRNONAA >60 12/07/2020 0204   GFRAA 119 06/17/2019 1035    No results found for: "SPEP", "UPEP"  Lab Results  Component Value Date   WBC 5.6 06/19/2023   NEUTROABS 2.8 06/19/2023   HGB 10.5 (L) 06/19/2023   HCT 33.5 (L) 06/19/2023   MCV 87.5 06/19/2023   PLT 388 06/19/2023      Chemistry      Component  Value Date/Time   NA 133 (L) 12/07/2020 0204   NA 141 06/17/2019 1035   K 3.2 (L) 12/07/2020 0204   CL 99 12/07/2020 0204   CO2 23 12/07/2020 0204   BUN 5 (L) 12/07/2020 0204   BUN 15 06/17/2019 1035   CREATININE 0.78 12/07/2020 0204      Component Value Date/Time   CALCIUM 8.4 (L) 12/07/2020 0204   ALKPHOS 41 12/06/2020 1256   AST 16 12/06/2020 1256   ALT 14 12/06/2020 1256   BILITOT 0.5 12/06/2020 1256   BILITOT <0.2 06/17/2019 1035

## 2023-06-20 ENCOUNTER — Telehealth: Payer: Self-pay

## 2023-06-20 ENCOUNTER — Telehealth: Payer: Self-pay | Admitting: Hematology and Oncology

## 2023-06-20 NOTE — Telephone Encounter (Signed)
-----   Message from Artis Delay sent at 06/20/2023  8:46 AM EST ----- Please call her Ferritin is adequate she does not need any more IV iron  Please place scheduling msg for labs and see me in 3 mo

## 2023-06-20 NOTE — Telephone Encounter (Signed)
Spoke with patient confirming upcoming appointment  

## 2023-06-20 NOTE — Telephone Encounter (Signed)
Patient aware that her recent iron studies look good and she will not require any more IV iron. Patient aware that a member of our scheduling team will be reaching out to her to schedule a follow-up visit for labs and visit with Dr. Bertis Ruddy in 3 months. Patient verbalized an understanding of the information and expressed appreciation for the call.

## 2023-07-24 ENCOUNTER — Other Ambulatory Visit: Payer: Commercial Managed Care - PPO

## 2023-07-24 ENCOUNTER — Ambulatory Visit: Payer: Commercial Managed Care - PPO | Admitting: Gastroenterology

## 2023-07-24 ENCOUNTER — Encounter: Payer: Self-pay | Admitting: Gastroenterology

## 2023-07-24 VITALS — BP 118/70 | HR 76 | Ht 67.0 in | Wt 160.4 lb

## 2023-07-24 DIAGNOSIS — D509 Iron deficiency anemia, unspecified: Secondary | ICD-10-CM

## 2023-07-24 DIAGNOSIS — K5900 Constipation, unspecified: Secondary | ICD-10-CM

## 2023-07-24 DIAGNOSIS — K5909 Other constipation: Secondary | ICD-10-CM | POA: Diagnosis not present

## 2023-07-24 DIAGNOSIS — Z1211 Encounter for screening for malignant neoplasm of colon: Secondary | ICD-10-CM

## 2023-07-24 DIAGNOSIS — Z8742 Personal history of other diseases of the female genital tract: Secondary | ICD-10-CM | POA: Diagnosis not present

## 2023-07-24 LAB — CBC WITH DIFFERENTIAL/PLATELET
Basophils Absolute: 0.1 10*3/uL (ref 0.0–0.1)
Basophils Relative: 2 % (ref 0.0–3.0)
Eosinophils Absolute: 0.2 10*3/uL (ref 0.0–0.7)
Eosinophils Relative: 3.4 % (ref 0.0–5.0)
HCT: 34.1 % — ABNORMAL LOW (ref 36.0–46.0)
Hemoglobin: 10.9 g/dL — ABNORMAL LOW (ref 12.0–15.0)
Lymphocytes Relative: 20 % (ref 12.0–46.0)
Lymphs Abs: 1.4 10*3/uL (ref 0.7–4.0)
MCHC: 32.1 g/dL (ref 30.0–36.0)
MCV: 87.4 fL (ref 78.0–100.0)
Monocytes Absolute: 0.8 10*3/uL (ref 0.1–1.0)
Monocytes Relative: 11 % (ref 3.0–12.0)
Neutro Abs: 4.5 10*3/uL (ref 1.4–7.7)
Neutrophils Relative %: 63.6 % (ref 43.0–77.0)
Platelets: 347 10*3/uL (ref 150.0–400.0)
RBC: 3.9 Mil/uL (ref 3.87–5.11)
RDW: 13.1 % (ref 11.5–15.5)
WBC: 7 10*3/uL (ref 4.0–10.5)

## 2023-07-24 LAB — IBC + FERRITIN
Ferritin: 55.2 ng/mL (ref 10.0–291.0)
Iron: 72 ug/dL (ref 42–145)
Saturation Ratios: 21.1 % (ref 20.0–50.0)
TIBC: 341.6 ug/dL (ref 250.0–450.0)
Transferrin: 244 mg/dL (ref 212.0–360.0)

## 2023-07-24 LAB — COMPREHENSIVE METABOLIC PANEL
ALT: 12 U/L (ref 0–35)
AST: 15 U/L (ref 0–37)
Albumin: 3.8 g/dL (ref 3.5–5.2)
Alkaline Phosphatase: 30 U/L — ABNORMAL LOW (ref 39–117)
BUN: 18 mg/dL (ref 6–23)
CO2: 26 meq/L (ref 19–32)
Calcium: 8.3 mg/dL — ABNORMAL LOW (ref 8.4–10.5)
Chloride: 108 meq/L (ref 96–112)
Creatinine, Ser: 0.91 mg/dL (ref 0.40–1.20)
GFR: 75.2 mL/min (ref >60.00)
Glucose, Bld: 94 mg/dL (ref 70–99)
Potassium: 3.4 meq/L — ABNORMAL LOW (ref 3.5–5.1)
Sodium: 140 meq/L (ref 135–145)
Total Bilirubin: 0.3 mg/dL (ref 0.2–1.2)
Total Protein: 6.4 g/dL (ref 6.0–8.3)

## 2023-07-24 MED ORDER — NA SULFATE-K SULFATE-MG SULF 17.5-3.13-1.6 GM/177ML PO SOLN: 1.0000 | ORAL | 0 refills | Status: AC

## 2023-07-24 NOTE — Progress Notes (Signed)
Chief Complaint: Iron Deficiency Anemia Primary GI MD: Gentry Fitz  HPI: 47 year old female with history of menorrhagia, IDA, obesity, and others as listed below presents for evaluation of IDA  Upon review of previous labs, hgb was as high as 11.3 in 2017.  Over the last few months she has had worsening anemia On March 8, hemoglobin was 9.1 with platelet count of 650 On March 19, hemoglobin 9.5 On April 12, hemoglobin 9.8 with ferritin of 9.7 On May 13, hemoglobin was 11 with a platelet count of 422 On November 7, hgb 10.5, MCV 87.5, iron 83, saturation 26%, TIBC 322, ferritin 77 with normal CMP and B12  Patient's menstrual cycle typically last about 7 days but she has had irregular menses in the past few months, she has been referred to GYN but has not been seen yet.  She has extreme fatigue and craving for ice.  She has been seen by Dr. Bertis Ruddy and received IV iron transfusions as she was having no improvement on oral iron.  Last infusion was 04/02/2023  Patient states she has had heavy periods her whole life but over the last few months they have been very irregular.  She states she is having 2 periods per month sometimes lasting anywhere from 7 to 12 days.  She states they are extremely heavy and that she often needs super tampons plus pads.  She is scheduled to see a gynecologist in the middle of January.  She denies any melena or hematochezia.  Denies nausea/vomiting.  Denies weight change.  She does note that she has chronic constipation ongoing for the majority of her life in which she will go a few days without bowel movements and when she does have a bowel movement sometimes it is hard and sometimes she has to strain.  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  She states she was previously on oral iron and had no issues with it but they noted oral iron was not affecting her iron levels enough which is why she switched to iron  infusions.  She is scheduled to follow-up with Dr. Bertis Ruddy in a few weeks.  Denies family history of colon cancer.  No previous colonoscopy.  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     ibuprofen (ADVIL) 200 MG tablet Take 800 mg by mouth every 6 (six) hours as needed for headache or moderate pain.     OZEMPIC, 1 MG/DOSE, 4 MG/3ML SOPN Inject 1 mg into the skin once a week.     phentermine (ADIPEX-P) 37.5 MG tablet Take 37.5 mg by mouth daily.     topiramate (TOPAMAX) 100 MG tablet Take 100 mg by mouth daily.     No current facility-administered medications  for this visit.    Allergies as of 07/24/2023   (No Known Allergies)    Family History  Problem Relation Age of Onset   Hypertension Mother    Asthma Mother    Diabetes Mother    High Cholesterol Mother    High blood pressure Mother    Obesity Mother    Thyroid disease Maternal Grandmother    Diabetes Maternal Grandmother    Other Father        unknown   Diabetes Father    Obesity Father    Heart disease Neg Hx    Stroke Neg Hx     Social History   Socioeconomic History   Marital status: Married    Spouse name: Ivar Drape   Number of children: 3   Years of education: Not on file   Highest education level: Not on file  Occupational History   Occupation: Engineer, production    Occupation: bus driver  Tobacco Use   Smoking status: Light Smoker    Current packs/day: 0.50    Average packs/day: 0.5 packs/day for 20.4 years (10.2 ttl pk-yrs)    Types: Cigarettes    Start date: 02/24/2003   Smokeless tobacco: Never  Vaping Use   Vaping status: Never Used  Substance and Sexual Activity   Alcohol use: Yes    Comment: occ   Drug use: No   Sexual activity: Not on file  Other Topics Concern   Not on file  Social History Narrative   Not on file   Social Drivers of Health   Financial Resource Strain: Not on file  Food Insecurity: Not on file  Transportation Needs: Not on file  Physical Activity: Not on file  Stress: Not on file  Social Connections: Unknown (10/28/2022)   Received from [facility], [facility]   Social Network    Social Network: Not on file  Intimate Partner Violence: Unknown (10/28/2022)   Received from [employer], [facility]   HITS    Physically Hurt: Not on file    Insult or Talk Down To: Not on file    Threaten Physical Harm: Not on file    Scream or Curse: Not on file    Review of Systems:    Constitutional: No weight loss, fever, chills, weakness or fatigue HEENT: Eyes: No change in vision               Ears, Nose, Throat:  No change in hearing or congestion Skin: No rash or itching Cardiovascular: No chest pain, chest pressure or palpitations   Respiratory: No SOB or cough Gastrointestinal: See HPI and otherwise negative Genitourinary: No dysuria or change in urinary frequency Neurological: No headache, dizziness or syncope Musculoskeletal: No new muscle or joint pain Hematologic: No bleeding or bruising Psychiatric: No history of depression or anxiety    Physical Exam:  Vital signs: There were no vitals taken for this visit.  Constitutional: NAD, Well developed, Well nourished, alert and cooperative Head:  Normocephalic and atraumatic. Eyes:   PEERL, EOMI. No icterus. Conjunctiva pink. Respiratory:  Respirations even and unlabored. Lungs clear to auscultation bilaterally.   No wheezes, crackles, or rhonchi.  Cardiovascular:  Regular rate and rhythm. No peripheral edema, cyanosis or pallor.  Gastrointestinal:  Soft, nondistended, nontender. No rebound or guarding. Normal bowel sounds. No appreciable masses or hepatomegaly. Rectal:  Not performed.  Msk:  Symmetrical without gross deformities. Without edema, no deformity or joint abnormality.  Neurologic:  Alert and  oriented x4;  grossly  normal neurologically.  Skin:   Dry and intact without significant lesions or rashes. Psychiatric: Oriented to person, place and time. Demonstrates good judgement and reason without abnormal affect or behaviors.   RELEVANT LABS AND IMAGING: CBC    Component Value Date/Time   WBC 5.6 06/19/2023 1210   WBC 12.7 (H) 12/08/2020 0019   RBC 3.83 (L) 06/19/2023 1210   HGB 10.5 (L) 06/19/2023 1210   HGB 11.3 01/15/2019 1427   HCT 33.5 (L) 06/19/2023 1210   HCT 35.4 01/15/2019 1427   PLT 388 06/19/2023 1210   PLT 385 01/15/2019 1427   MCV 87.5 06/19/2023 1210   MCV 78 (L) 01/15/2019 1427   MCH 27.4 06/19/2023 1210   MCHC 31.3 06/19/2023 1210   RDW 13.2 06/19/2023 1210   RDW 13.6 01/15/2019 1427   LYMPHSABS 2.0 06/19/2023 1210   MONOABS 0.5 06/19/2023 1210   EOSABS 0.2 06/19/2023 1210   BASOSABS 0.1 06/19/2023 1210    CMP     Component Value Date/Time   NA 141 06/19/2023 1210   NA 141 06/17/2019 1035   K 3.9 06/19/2023 1210   CL 109 06/19/2023 1210   CO2 26 06/19/2023 1210   GLUCOSE 77 06/19/2023 1210   BUN 18 06/19/2023 1210   BUN 15 06/17/2019 1035   CREATININE 1.08 (H) 06/19/2023 1210   CALCIUM 8.5 (L) 06/19/2023 1210   PROT 6.5 06/19/2023 1210   PROT 6.7 06/17/2019 1035   ALBUMIN 3.8 06/19/2023 1210   ALBUMIN 4.0 06/17/2019 1035   AST 15 06/19/2023 1210   ALT 12 06/19/2023 1210   ALKPHOS 25 (L) 06/19/2023 1210   BILITOT 0.4 06/19/2023 1210   GFRNONAA >60 06/19/2023 1210   GFRAA  119 06/17/2019 1035     Assessment/Plan:   Iron deficiency anemia History of very heavy menses occurring twice per month and sometimes 7 to 12 days going through super tampons and pads.  Suspect this is the source of her worsening iron deficiency anemia.  Fortunately she does have a GYN appointment coming up.  However, we have been tasked with ruling out GI blood loss due to persistent anemia.  No overt GI bleeding which is reassuring.  No previous endoscopic evaluation.  She is on iron infusions and follows with Dr. Bertis Ruddy with last infusion as of August. - EGD/colonoscopy for further evaluation - CBC, CMP, IBC and ferritin - Continue to follow-up for iron infusions - Stressed importance of following with GYN as this is likely the source of her iron deficiency anemia from her heavy menses - I thoroughly discussed the procedure with the patient (at bedside) to include nature of the procedure, alternatives, benefits, and risks (including but not limited to bleeding, infection, perforation, anesthesia/cardiac pulmonary complications).  Patient verbalized understanding and gave verbal consent to proceed with procedure.   Chronic constipation She has tried fiber in the past without relief.  Can typically go 2 to 3 days without a bowel movement.  No change while on iron supplementation. - MiraLAX 1 capful daily and adjust dose based on response - Increase water, increase fiber and increase exercise  Screening for colon cancer No previous screening. Due based on age.  Lara Mulch [facility] 07/24/2023, 12:48 PM  Cc: Center, [facility]

## 2023-07-24 NOTE — Patient Instructions (Addendum)
_______________________________________________________  If your blood pressure at your visit was 140/90 or greater, please contact your primary care physician to follow up on this.  If you are age 47 or younger, your body mass index should be between 19-25. Your Body mass index is 25.12 kg/m. If this is out of the aformentioned range listed, please consider follow up with your Primary Care Provider.  ________________________________________________________  The [facility] providers would like to encourage you to use [facility] to communicate with providers for non-urgent requests or questions.  Due to long hold times on the telephone, sending your provider a message by [facility] may be a faster and more efficient way to get a response.  Please allow 48 business hours for a response.  Please remember that this is for non-urgent requests.  _______________________________________________________  Your provider has requested that you go to the basement level for lab work before leaving today. Press "B" on the elevator. The lab is located at the first door on the left as you exit the elevator.  You have been scheduled for an endoscopy and colonoscopy. Please follow the written instructions given to you at your visit today.  Please pick up your prep supplies at the pharmacy within the next 1-3 days.  If you use inhalers (even only as needed), please bring them with you on the day of your procedure.  DO NOT TAKE 7 DAYS PRIOR TO TEST- Trulicity (dulaglutide) Ozempic, Wegovy (semaglutide) Mounjaro (tirzepatide) Bydureon Bcise (exanatide extended release)  DO NOT TAKE 1 DAY PRIOR TO YOUR TEST Rybelsus (semaglutide) Adlyxin (lixisenatide) Victoza (liraglutide) Byetta (exanatide) ___________________________________________________________________________  Due to recent changes in healthcare laws, you may see the results of your imaging and laboratory studies on MyChart before your provider has had a  chance to review them.  We understand that in some cases there may be results that are confusing or concerning to you. Not all laboratory results come back in the same time frame and the provider may be waiting for multiple results in order to interpret others.  Please give [place] 48 hours in order for your provider to thoroughly review all the results before contacting the office for clarification of your results.   Start taking Miralax 1 capful (17 grams) 1x / day for 1 week.   If this is not effective, increase to 1 dose 2x / day for 1 week.   If this is still not effective, increase to two capfuls (34 grams) 2x / day.   Can adjust dose as needed based on response. Can take 1/2 cap daily, skip days, or increase per day.    Thank you for entrusting me with your care and choosing [facility].  [place], PA-C

## 2023-07-28 ENCOUNTER — Encounter: Payer: Self-pay | Admitting: Hematology and Oncology

## 2023-07-29 NOTE — Progress Notes (Signed)
Agree with the assessment and plan as outlined by Boone Master, PA-C. Very low suspicion for GI source of anemia based on description of menstrual bleeding, but patient needs a colonoscopy for routine colon cancer screening.  Ok to add EGD to rule out upper GI sources as well.

## 2023-09-02 ENCOUNTER — Encounter: Payer: Self-pay | Admitting: Hematology and Oncology

## 2023-09-11 ENCOUNTER — Encounter: Payer: Commercial Managed Care - PPO | Admitting: Gastroenterology

## 2023-09-22 ENCOUNTER — Telehealth: Payer: Self-pay | Admitting: Hematology and Oncology

## 2023-09-22 ENCOUNTER — Inpatient Hospital Stay: Payer: Commercial Managed Care - PPO

## 2023-09-22 NOTE — Telephone Encounter (Signed)
 Rescheduled appointments per patient request. Patient is aware of the changes made and is active on MyChart.

## 2023-09-25 ENCOUNTER — Other Ambulatory Visit: Payer: Self-pay | Admitting: Hematology and Oncology

## 2023-09-25 DIAGNOSIS — D500 Iron deficiency anemia secondary to blood loss (chronic): Secondary | ICD-10-CM

## 2023-09-26 ENCOUNTER — Inpatient Hospital Stay: Payer: Commercial Managed Care - PPO | Attending: Nurse Practitioner

## 2023-09-30 ENCOUNTER — Telehealth: Payer: Commercial Managed Care - PPO | Admitting: Hematology and Oncology

## 2023-10-03 ENCOUNTER — Encounter: Payer: Self-pay | Admitting: Hematology and Oncology

## 2023-10-03 ENCOUNTER — Telehealth: Payer: Self-pay

## 2023-10-03 ENCOUNTER — Inpatient Hospital Stay: Payer: Commercial Managed Care - PPO | Admitting: Hematology and Oncology

## 2023-10-03 NOTE — Telephone Encounter (Signed)
Called and left a message regarding phone visit at 8 am today. Dr. Bertis Ruddy attempted to call her and no answer. Please call the office back to reschedule MD appt and lab appt she missed recently.

## 2023-10-13 ENCOUNTER — Telehealth: Payer: Self-pay | Admitting: Gastroenterology

## 2023-10-13 NOTE — Telephone Encounter (Signed)
 Patient cancelled procedure for 3/7. States she is unable to make it. Patient will call back to reschedule.

## 2023-10-17 ENCOUNTER — Encounter: Payer: Commercial Managed Care - PPO | Admitting: Gastroenterology

## 2024-02-27 ENCOUNTER — Encounter: Payer: Self-pay | Admitting: Advanced Practice Midwife
